# Patient Record
Sex: Male | Born: 1960
Health system: Southern US, Community
[De-identification: ages and names within clinical notes are randomized; demographics above are authoritative.]

## PROBLEM LIST (undated history)

## (undated) DIAGNOSIS — I10 Essential (primary) hypertension: Secondary | ICD-10-CM

## (undated) DIAGNOSIS — E119 Type 2 diabetes mellitus without complications: Secondary | ICD-10-CM

## (undated) DIAGNOSIS — E7800 Pure hypercholesterolemia, unspecified: Secondary | ICD-10-CM

## (undated) HISTORY — PX: HAND TENDON SURGERY: SHX663

---

## 2002-06-28 ENCOUNTER — Encounter: Payer: Self-pay | Admitting: Orthopedic Surgery

## 2002-06-28 ENCOUNTER — Ambulatory Visit (HOSPITAL_COMMUNITY): Admission: RE | Admit: 2002-06-28 | Discharge: 2002-06-28 | Payer: Self-pay | Admitting: Orthopedic Surgery

## 2003-08-30 ENCOUNTER — Emergency Department (HOSPITAL_COMMUNITY): Admission: EM | Admit: 2003-08-30 | Discharge: 2003-08-30 | Payer: Self-pay | Admitting: Emergency Medicine

## 2011-03-26 ENCOUNTER — Emergency Department (HOSPITAL_COMMUNITY): Payer: Self-pay

## 2011-03-26 ENCOUNTER — Inpatient Hospital Stay (INDEPENDENT_AMBULATORY_CARE_PROVIDER_SITE_OTHER)
Admission: RE | Admit: 2011-03-26 | Discharge: 2011-03-26 | Disposition: A | Discharge status: Home / Self Care | Payer: Self-pay | Source: Ambulatory Visit | Attending: Emergency Medicine | Admitting: Emergency Medicine

## 2011-03-26 ENCOUNTER — Emergency Department (HOSPITAL_COMMUNITY)
Admission: EM | Admit: 2011-03-26 | Discharge: 2011-03-27 | Disposition: A | Discharge status: Home / Self Care | Payer: Self-pay | Attending: Emergency Medicine | Admitting: Emergency Medicine

## 2011-03-26 DIAGNOSIS — R42 Dizziness and giddiness: Secondary | ICD-10-CM

## 2011-03-26 DIAGNOSIS — X30XXXA Exposure to excessive natural heat, initial encounter: Secondary | ICD-10-CM | POA: Insufficient documentation

## 2011-03-26 DIAGNOSIS — R0609 Other forms of dyspnea: Secondary | ICD-10-CM

## 2011-03-26 DIAGNOSIS — E119 Type 2 diabetes mellitus without complications: Secondary | ICD-10-CM | POA: Insufficient documentation

## 2011-03-26 DIAGNOSIS — R5381 Other malaise: Secondary | ICD-10-CM | POA: Insufficient documentation

## 2011-03-26 DIAGNOSIS — E7800 Pure hypercholesterolemia, unspecified: Secondary | ICD-10-CM | POA: Insufficient documentation

## 2011-03-26 DIAGNOSIS — T675XXA Heat exhaustion, unspecified, initial encounter: Secondary | ICD-10-CM | POA: Insufficient documentation

## 2011-03-26 DIAGNOSIS — R290 Tetany: Secondary | ICD-10-CM | POA: Insufficient documentation

## 2011-03-26 DIAGNOSIS — Z79899 Other long term (current) drug therapy: Secondary | ICD-10-CM | POA: Insufficient documentation

## 2011-03-26 DIAGNOSIS — R61 Generalized hyperhidrosis: Secondary | ICD-10-CM | POA: Insufficient documentation

## 2011-03-26 DIAGNOSIS — I10 Essential (primary) hypertension: Secondary | ICD-10-CM | POA: Insufficient documentation

## 2011-03-26 DIAGNOSIS — R55 Syncope and collapse: Secondary | ICD-10-CM | POA: Insufficient documentation

## 2011-03-26 LAB — DIFFERENTIAL
Eosinophils Absolute: 0.2 10*3/uL (ref 0.0–0.7)
Lymphocytes Relative: 27 % (ref 12–46)
Lymphs Abs: 3.2 10*3/uL (ref 0.7–4.0)
Monocytes Relative: 12 % (ref 3–12)
Neutro Abs: 7.3 10*3/uL (ref 1.7–7.7)
Neutrophils Relative %: 60 % (ref 43–77)

## 2011-03-26 LAB — URINALYSIS, ROUTINE W REFLEX MICROSCOPIC
Bilirubin Urine: NEGATIVE
Nitrite: NEGATIVE
Specific Gravity, Urine: 1.013 (ref 1.005–1.030)
Urobilinogen, UA: 0.2 mg/dL (ref 0.0–1.0)

## 2011-03-26 LAB — URINE MICROSCOPIC-ADD ON

## 2011-03-26 LAB — TROPONIN I: Troponin I: <0.3 ng/mL (ref <0.30)

## 2011-03-26 LAB — POCT I-STAT, CHEM 8
HCT: 47 % (ref 39.0–52.0)
Hemoglobin: 16 g/dL (ref 13.0–17.0)
Potassium: 4.1 mEq/L (ref 3.5–5.1)
Sodium: 131 mEq/L — ABNORMAL LOW (ref 135–145)

## 2011-03-26 LAB — CBC
Hemoglobin: 15.7 g/dL (ref 13.0–17.0)
MCH: 29.6 pg (ref 26.0–34.0)
MCV: 77.7 fL — ABNORMAL LOW (ref 78.0–100.0)
Platelets: 199 10*3/uL (ref 150–400)
RBC: 5.3 MIL/uL (ref 4.22–5.81)
WBC: 12.2 10*3/uL — ABNORMAL HIGH (ref 4.0–10.5)

## 2011-03-26 LAB — CK TOTAL AND CKMB (NOT AT ARMC): CK, MB: 4 ng/mL (ref 0.3–4.0)

## 2011-03-28 LAB — GLUCOSE, CAPILLARY

## 2013-08-15 ENCOUNTER — Encounter (HOSPITAL_COMMUNITY): Payer: Self-pay | Admitting: Emergency Medicine

## 2013-08-15 ENCOUNTER — Emergency Department (HOSPITAL_COMMUNITY): Payer: Worker's Compensation

## 2013-08-15 ENCOUNTER — Emergency Department (HOSPITAL_COMMUNITY)
Admission: EM | Admit: 2013-08-15 | Discharge: 2013-08-15 | Disposition: A | Discharge status: Home / Self Care | Payer: Worker's Compensation | Attending: Emergency Medicine | Admitting: Emergency Medicine

## 2013-08-15 DIAGNOSIS — W010XXA Fall on same level from slipping, tripping and stumbling without subsequent striking against object, initial encounter: Secondary | ICD-10-CM | POA: Insufficient documentation

## 2013-08-15 DIAGNOSIS — S8992XA Unspecified injury of left lower leg, initial encounter: Secondary | ICD-10-CM

## 2013-08-15 DIAGNOSIS — S8990XA Unspecified injury of unspecified lower leg, initial encounter: Secondary | ICD-10-CM | POA: Insufficient documentation

## 2013-08-15 DIAGNOSIS — M129 Arthropathy, unspecified: Secondary | ICD-10-CM | POA: Insufficient documentation

## 2013-08-15 DIAGNOSIS — E119 Type 2 diabetes mellitus without complications: Secondary | ICD-10-CM | POA: Insufficient documentation

## 2013-08-15 DIAGNOSIS — R609 Edema, unspecified: Secondary | ICD-10-CM | POA: Insufficient documentation

## 2013-08-15 DIAGNOSIS — E7800 Pure hypercholesterolemia, unspecified: Secondary | ICD-10-CM | POA: Insufficient documentation

## 2013-08-15 DIAGNOSIS — Y990 Civilian activity done for income or pay: Secondary | ICD-10-CM | POA: Insufficient documentation

## 2013-08-15 DIAGNOSIS — Y929 Unspecified place or not applicable: Secondary | ICD-10-CM | POA: Insufficient documentation

## 2013-08-15 DIAGNOSIS — I10 Essential (primary) hypertension: Secondary | ICD-10-CM | POA: Insufficient documentation

## 2013-08-15 HISTORY — DX: Type 2 diabetes mellitus without complications: E11.9

## 2013-08-15 HISTORY — DX: Pure hypercholesterolemia, unspecified: E78.00

## 2013-08-15 HISTORY — DX: Essential (primary) hypertension: I10

## 2013-08-15 MED ORDER — HYDROCODONE-ACETAMINOPHEN 5-325 MG PO TABS: 2.0000 | ORAL_TABLET | ORAL | Status: DC | PRN

## 2013-08-15 NOTE — ED Notes (Signed)
Pt states he fell last night at his job, complaining of L leg/knee pain/swelling

## 2013-08-15 NOTE — ED Provider Notes (Signed)
CSN: [id number]     Arrival date & time 08/15/13  1129 History  This chart was scribed for non-physician practitioner working with Rick Baker, MD by Rick Anderson, ED scribe. This patient was seen in room Rick Anderson/Rick Anderson and the patient's care was started at 12:58 PM.  First MD Initiated Contact with Patient 08/15/13 1222     Chief Complaint  Patient presents with  . Leg Injury    left   (Consider location/radiation/quality/duration/timing/severity/associated sxs/prior Treatment) Patient is a 52 y.o. male presenting with leg pain. The history is provided by the patient and medical records. No language interpreter was used.  Leg Pain Location:  Hip, leg, ankle and foot Time since incident:  1 day Injury: yes   Mechanism of injury: fall   Fall:    Fall occurred:  Walking   Impact surface:  Concrete   Entrapped after fall: no   Hip location:  L hip Leg location:  L leg Ankle location:  L ankle Foot location:  L foot Pain details:    Quality:  Shooting and sharp   Severity:  Moderate   Onset quality:  Sudden   Timing:  Constant Chronicity:  Recurrent Foreign body present:  No foreign bodies Tetanus status:  Up to date Prior injury to area:  Yes Relieved by:  Nothing Worsened by:  Nothing tried Ineffective treatments:  None tried Associated symptoms: swelling   Associated symptoms: no numbness    HPI Comments: Rick Anderson is a 52 y.o. male who presents to the Emergency Department complaining of constant, moderate, leg pain after falling at his job, [employer]. Pt reports his leg "buckled back" while falling on slippery surface. Pt has a previous injury to his left knee after falling and was out of work for a while. Pt was able to walk after the fall however hours after he experienced trouble walking due to pain. He states the pain goes "through internally" and is sharp. Pt also complains of left ankle pain and left hip pain that presents with swelling. He explains the left foot  had a numb sensation last night but resolved today. He did not try anything for pain. Pt has a hx of left leg arthritis. Pt has a medical hx of DM, HTN and high cholesterol.  Pt does not have any known allergies to medications.      Past Medical History  Diagnosis Date  . Diabetes mellitus without complication   . Hypertension   . High cholesterol    Past Surgical History  Procedure Laterality Date  . Hand tendon surgery     No family history on file. History  Substance Use Topics  . Smoking status: Never Smoker   . Smokeless tobacco: Never Used  . Alcohol Use: No    Review of Systems  Cardiovascular: Positive for leg swelling.  Musculoskeletal: Positive for arthralgias, gait problem, joint swelling and myalgias.  Neurological: Negative for numbness.    Allergies  Ampicillin  Home Medications  No current outpatient prescriptions on file. BP 142/81  Pulse 74  Temp(Src) 98.7 F (37.1 C) (Oral)  Resp 16  Ht 6\' 2"  (1.88 m)  Wt 295 lb (133.811 kg)  BMI 37.86 kg/m2  SpO2 100% Physical Exam  Constitutional: He appears well-developed and well-nourished. No distress.  HENT:  Head: Atraumatic.  Eyes: Conjunctivae are normal.  Neck: Normal range of motion. Neck supple.  Musculoskeletal: Normal range of motion. He exhibits edema and tenderness.       Left  hip: Normal.   Joint tenderness noted the posterior aspect of patella. No obvious deformities Neg draw test anteror and posterior No vargus and valgus maneuver  Mild crepitus noted with patella ballottement. No obvious edema   Neurological: He is alert.  Pt able to ambulate  Skin: No rash noted.  Psychiatric: He has a normal mood and affect.    ED Course  Procedures (including critical care time) DIAGNOSTIC STUDIES: Oxygen Saturation is 100% on room air, normal by my interpretation.    COORDINATION OF CARE: 1:01 PM Discussed course of care with pt which includes x-ray testing . Pt understands and agrees.   Likely meniscal tear or internal derangement.  Doubt acute fx or dislocation.  Is NVI.    Labs Review Labs Reviewed - No data to display Imaging Review Dg Knee Complete 4 Views Left  08/15/2013   CLINICAL DATA:  pain post trauma  EXAM: LEFT KNEE - COMPLETE 4+ VIEW  COMPARISON:  left knee MRI June 30, 2011  FINDINGS: Frontal, lateral, and bilateral oblique views were obtained. There is no fracture, dislocation, or effusion. There is moderate osteoarthritic change. No erosive change.  IMPRESSION: Moderate osteoarthritic change. No fracture or effusion.   Electronically Signed   By: Bretta Bang M.D.   On: 08/15/2013 14:11    EKG Interpretation   None       MDM   1. Left knee injury, initial encounter    BP 142/81  Pulse 74  Temp(Src) 98.7 F (37.1 C) (Oral)  Resp 16  Ht 6\' 2"  (1.88 m)  Wt 295 lb (133.811 kg)  BMI 37.86 kg/m2  SpO2 100%  I have reviewed nursing notes and vital signs. I personally reviewed the imaging tests through PACS system  I reviewed available ER/hospitalization records thought the EMR  I personally performed the services described in this documentation, which was scribed in my presence. The recorded information has been reviewed and is accurate.      Fayrene Helper, PA-C 08/15/13 1422

## 2013-08-16 NOTE — ED Provider Notes (Signed)
Medical screening examination/treatment/procedure(s) were performed by non-physician practitioner and as supervising physician I was immediately available for consultation/collaboration.   Vanissa Strength T Dung Prien, MD 08/16/13 1551 

## 2015-04-21 ENCOUNTER — Other Ambulatory Visit: Payer: Self-pay | Admitting: Family Medicine

## 2015-04-21 DIAGNOSIS — B1910 Unspecified viral hepatitis B without hepatic coma: Secondary | ICD-10-CM

## 2015-05-04 ENCOUNTER — Ambulatory Visit
Admission: RE | Admit: 2015-05-04 | Discharge: 2015-05-04 | Disposition: A | Discharge status: Home / Self Care | Payer: Non-veteran care | Source: Ambulatory Visit | Attending: Family Medicine | Admitting: Family Medicine

## 2015-05-04 DIAGNOSIS — B1910 Unspecified viral hepatitis B without hepatic coma: Secondary | ICD-10-CM

## 2015-05-30 ENCOUNTER — Emergency Department (HOSPITAL_COMMUNITY): Payer: Non-veteran care

## 2015-05-30 ENCOUNTER — Encounter (HOSPITAL_COMMUNITY): Payer: Self-pay | Admitting: Emergency Medicine

## 2015-05-30 ENCOUNTER — Emergency Department (HOSPITAL_COMMUNITY)
Admission: EM | Admit: 2015-05-30 | Discharge: 2015-05-30 | Disposition: A | Discharge status: Home / Self Care | Payer: Non-veteran care | Attending: Emergency Medicine | Admitting: Emergency Medicine

## 2015-05-30 DIAGNOSIS — E119 Type 2 diabetes mellitus without complications: Secondary | ICD-10-CM | POA: Insufficient documentation

## 2015-05-30 DIAGNOSIS — R55 Syncope and collapse: Secondary | ICD-10-CM | POA: Insufficient documentation

## 2015-05-30 DIAGNOSIS — N289 Disorder of kidney and ureter, unspecified: Secondary | ICD-10-CM | POA: Insufficient documentation

## 2015-05-30 DIAGNOSIS — I10 Essential (primary) hypertension: Secondary | ICD-10-CM | POA: Insufficient documentation

## 2015-05-30 LAB — BASIC METABOLIC PANEL
ANION GAP: 13 (ref 5–15)
BUN: 21 mg/dL — ABNORMAL HIGH (ref 6–20)
CO2: 23 mmol/L (ref 22–32)
CREATININE: 2.16 mg/dL — AB (ref 0.61–1.24)
Calcium: 9 mg/dL (ref 8.9–10.3)
Chloride: 97 mmol/L — ABNORMAL LOW (ref 101–111)
GFR calc Af Amer: 38 mL/min — ABNORMAL LOW (ref >60)
GFR, EST NON AFRICAN AMERICAN: 32 mL/min — AB (ref >60)
Glucose, Bld: 265 mg/dL — ABNORMAL HIGH (ref 65–99)
POTASSIUM: 4.5 mmol/L (ref 3.5–5.1)
SODIUM: 133 mmol/L — AB (ref 135–145)

## 2015-05-30 LAB — CBC WITH DIFFERENTIAL/PLATELET
BASOS ABS: 0 10*3/uL (ref 0.0–0.1)
Basophils Relative: 0 % (ref 0–1)
EOS ABS: 0 10*3/uL (ref 0.0–0.7)
Eosinophils Relative: 0 % (ref 0–5)
HCT: 38.1 % — ABNORMAL LOW (ref 39.0–52.0)
HEMOGLOBIN: 13.6 g/dL (ref 13.0–17.0)
LYMPHS ABS: 2 10*3/uL (ref 0.7–4.0)
Lymphocytes Relative: 13 % (ref 12–46)
MCH: 28.9 pg (ref 26.0–34.0)
MCHC: 35.7 g/dL (ref 30.0–36.0)
MCV: 80.9 fL (ref 78.0–100.0)
Monocytes Absolute: 1.3 10*3/uL — ABNORMAL HIGH (ref 0.1–1.0)
Monocytes Relative: 8 % (ref 3–12)
NEUTROS ABS: 11.5 10*3/uL — AB (ref 1.7–7.7)
Neutrophils Relative %: 79 % — ABNORMAL HIGH (ref 43–77)
PLATELETS: 256 10*3/uL (ref 150–400)
RBC: 4.71 MIL/uL (ref 4.22–5.81)
RDW: 14.5 % (ref 11.5–15.5)
WBC: 14.8 10*3/uL — AB (ref 4.0–10.5)

## 2015-05-30 LAB — CBG MONITORING, ED: GLUCOSE-CAPILLARY: 261 mg/dL — AB (ref 65–99)

## 2015-05-30 LAB — I-STAT TROPONIN, ED
TROPONIN I, POC: 0.01 ng/mL (ref 0.00–0.08)
TROPONIN I, POC: 0 ng/mL (ref 0.00–0.08)

## 2015-05-30 NOTE — ED Provider Notes (Signed)
CSN: [id number]     Arrival date & time 05/30/15  1653 History   First MD Initiated Contact with Patient 05/30/15 1655     Chief Complaint  Patient presents with  . Loss of Consciousness  . overheated      (Consider location/radiation/quality/duration/timing/severity/associated sxs/prior Treatment) HPI   PCP: No primary care provider on file. Blood pressure 114/58, pulse 93, temperature 99 F (37.2 C), temperature source Oral, resp. rate 16, height  (1.88 m), weight 290 lb (131.543 kg), SpO2 98 %.  Rick Anderson is a 54 y.o.male with a significant PMH of diabetes, hypertension, and high cholesterol presents to the ER with complaints of syncope. The patient reports eating and drinking much all day and a lot of time outside, the weather is in the 90's today. He reports feeling fluttering in his chest with pressure for about 30 minutes before his syncopal episode. He was doing an interview  With [employer] and had to ask to sit down. The pain went away for approx 15 minutes but then it came back and he told his wife that he needed to go to the hospital. THe patient is requesting food and drink. He says that his CP will come back without eating or drinking anything because he is so hungry.  The patient denies diaphoresis, fever, headache, weakness (general or focal), confusion, change of vision,  neck pain, dysphagia, aphagia, chest pain, shortness of breath,  back pain, abdominal pains, nausea, vomiting, diarrhea, lower extremity swelling, rash.   Past Medical History  Diagnosis Date  . Diabetes mellitus without complication   . Hypertension   . High cholesterol    Past Surgical History  Procedure Laterality Date  . Hand tendon surgery     No family history on file. Social History  Substance Use Topics  . Smoking status: Never Smoker   . Smokeless tobacco: Never Used  . Alcohol Use: No    Review of Systems  10 Systems reviewed and are negative for acute change except  as noted in the HPI.   Allergies  Ampicillin  Home Medications   Prior to Admission medications   Medication Sig Start Date End Date Taking? Authorizing Provider  HYDROcodone-acetaminophen (NORCO/VICODIN) 5-325 MG per tablet Take 2 tablets by mouth every 4 (four) hours as needed for pain. 08/15/13   Fayrene Helper, PA-C   BP 115/63 mmHg  Pulse 94  Temp(Src) 99 F (37.2 C) (Oral)  Resp 17  Ht  (1.88 m)  Wt 290 lb (131.543 kg)  BMI 37.22 kg/m2  SpO2 98% Physical Exam  Constitutional: He is oriented to person, place, and time. He appears well-developed and well-nourished. No distress.  Morbidly obese  HENT:  Head: Normocephalic and atraumatic.  Eyes: Pupils are equal, round, and reactive to light.  Neck: Normal range of motion. Neck supple.  Cardiovascular: Normal rate and regular rhythm.   Pulmonary/Chest: Effort normal. No accessory muscle usage. No respiratory distress. He exhibits no tenderness, no bony tenderness and no retraction.  Abdominal: Soft.  Musculoskeletal:  No lower extremity swelling.  Neurological: He is alert and oriented to person, place, and time.  Skin: Skin is warm and dry.  Nursing note and vitals reviewed.   ED Course  Procedures (including critical care time) Labs Review Labs Reviewed  BASIC METABOLIC PANEL - Abnormal; Notable for the following:    Sodium 133 (*)    Chloride 97 (*)    Glucose, Bld 265 (*)    BUN  21 (*)    Creatinine, Ser 2.16 (*)    GFR calc non Af Amer 32 (*)    GFR calc Af Amer 38 (*)    All other components within normal limits  CBC WITH DIFFERENTIAL/PLATELET - Abnormal; Notable for the following:    WBC 14.8 (*)    HCT 38.1 (*)    Neutrophils Relative % 79 (*)    Neutro Abs 11.5 (*)    Monocytes Absolute 1.3 (*)    All other components within normal limits  CBG MONITORING, ED  I-STAT TROPOININ, ED  Rosezena Sensor, ED    Imaging Review Dg Chest 2 View  05/30/2015   CLINICAL DATA:  Dizzy.  Shortness breath.   Midsternal chest pain.  EXAM: CHEST - 2 VIEW  COMPARISON:  Two-view chest x-ray 03/26/2011  FINDINGS: The heart size is normal. Lung volumes are low. Interstitial coarsening is similar to the prior study. No significant airspace consolidation is evident. The visualized soft tissues and bony thorax are unremarkable.  IMPRESSION: 1. Similar appearance of lower lobe interstitial prominence suggesting fibrosis or scarring. This may represent atelectasis. 2. Low lung volumes.   Electronically Signed   By: Marin Roberts M.D.   On: 05/30/2015 18:52   I, Dorthula Matas, personally reviewed and evaluated these images and lab results as part of my medical decision-making.   EKG Interpretation   Date/Time:  Saturday May 30 2015 16:59:23 EDT Ventricular Rate:  91 PR Interval:  171 QRS Duration: 90 QT Interval:  336 QTC Calculation: 413 R Axis:   59 Text Interpretation:  Sinus rhythm Probable anteroseptal infarct, old  Nonspecific T abnormalities, inferior leads No significant change since  last tracing Confirmed by BEATON  MD, Sanjit ([id number]) on 05/30/2015 5:34:03  PM      MDM   Final diagnoses:  Syncope, unspecified syncope type  Renal insufficiency   Discussed patient with Dr. Radford Pax, his symptoms are most likely due to dehydration as he has some renal insufficiency. Patient admits not eating or drink anything today, doesn't know why. Otherwise his labs are unremarkable, negative troponin and nonacute chest x-ray. Will give referral to Cardiology and back to PCP.   Pt rehydrated in the ED.  Medications - No data to display  54 y.o.Rick Anderson's evaluation in the Emergency Department is complete. It has been determined that no acute conditions requiring further emergency intervention are present at this time. The patient/guardian have been advised of the diagnosis and plan. We have discussed signs and symptoms that warrant return to the ED, such as changes or worsening in  symptoms.  Vital signs are stable at discharge. Filed Vitals:   05/30/15 1930  BP: 115/63  Pulse: 94  Temp:   Resp: 17    Patient/guardian has voiced understanding and agreed to follow-up with the PCP or specialist.     Marlon Pel, PA-C 05/30/15 2109  Nelva Nay, MD 05/31/15 2318

## 2015-05-30 NOTE — ED Notes (Addendum)
To ED via [facility] with syncopal episode after being outside all day-- received 500cc/NS per EMS . Pt alert on arrival -- states feels better. Asking for food-- has not eaten or had anything to drink all day

## 2015-05-30 NOTE — ED Notes (Signed)
Pt eating salad, alert-- states he feels better--

## 2015-05-30 NOTE — Discharge Instructions (Signed)
Dehydration, Adult Dehydration is when you lose more fluids from the body than you take in. Vital organs like the kidneys, brain, and heart cannot function without a proper amount of fluids and salt. Any loss of fluids from the body can cause dehydration.  CAUSES   Vomiting.  Diarrhea.  Excessive sweating.  Excessive urine output.  Fever. SYMPTOMS  Mild dehydration  Thirst.  Dry lips.  Slightly dry mouth. Moderate dehydration  Very dry mouth.  Sunken eyes.  Skin does not bounce back quickly when lightly pinched and released.  Dark urine and decreased urine production.  Decreased tear production.  Headache. Severe dehydration  Very dry mouth.  Extreme thirst.  Rapid, weak pulse (more than 100 beats per minute at rest).  Cold hands and feet.  Not able to sweat in spite of heat and temperature.  Rapid breathing.  Blue lips.  Confusion and lethargy.  Difficulty being awakened.  Minimal urine production.  No tears. DIAGNOSIS  Your caregiver will diagnose dehydration based on your symptoms and your exam. Blood and urine tests will help confirm the diagnosis. The diagnostic evaluation should also identify the cause of dehydration. TREATMENT  Treatment of mild or moderate dehydration can often be done at home by increasing the amount of fluids that you drink. It is best to drink small amounts of fluid more often. Drinking too much at one time can make vomiting worse. Refer to the home care instructions below. Severe dehydration needs to be treated at the hospital where you will probably be given intravenous (IV) fluids that contain water and electrolytes. HOME CARE INSTRUCTIONS   Ask your caregiver about specific rehydration instructions.  Drink enough fluids to keep your urine clear or pale yellow.  Drink small amounts frequently if you have nausea and vomiting.  Eat as you normally do.  Avoid:  Foods or drinks high in sugar.  Carbonated  drinks.  Juice.  Extremely hot or cold fluids.  Drinks with caffeine.  Fatty, greasy foods.  Alcohol.  Tobacco.  Overeating.  Gelatin desserts.  Wash your hands well to avoid spreading bacteria and viruses.  Only take over-the-counter or prescription medicines for pain, discomfort, or fever as directed by your caregiver.  Ask your caregiver if you should continue all prescribed and over-the-counter medicines.  Keep all follow-up appointments with your caregiver. SEEK MEDICAL CARE IF:  You have abdominal pain and it increases or stays in one area (localizes).  You have a rash, stiff neck, or severe headache.  You are irritable, sleepy, or difficult to awaken.  You are weak, dizzy, or extremely thirsty. SEEK IMMEDIATE MEDICAL CARE IF:   You are unable to keep fluids down or you get worse despite treatment.  You have frequent episodes of vomiting or diarrhea.  You have blood or green matter (bile) in your vomit.  You have blood in your stool or your stool looks black and tarry.  You have not urinated in 6 to 8 hours, or you have only urinated a small amount of very dark urine.  You have a fever.  You faint. MAKE SURE YOU:   Understand these instructions.  Will watch your condition.  Will get help right away if you are not doing well or get worse. Document Released: 10/03/2005 Document Revised: 12/26/2011 Document Reviewed: 05/23/2011 Exit[employer] Patient Information 2015 [employer]. This information is not intended to replace advice given to you by your health care provider. Make sure you discuss any questions you have with your health care   provider.  

## 2015-11-07 ENCOUNTER — Ambulatory Visit (INDEPENDENT_AMBULATORY_CARE_PROVIDER_SITE_OTHER): Payer: Non-veteran care | Admitting: Physician Assistant

## 2015-11-07 DIAGNOSIS — E119 Type 2 diabetes mellitus without complications: Secondary | ICD-10-CM | POA: Insufficient documentation

## 2015-11-07 DIAGNOSIS — K047 Periapical abscess without sinus: Secondary | ICD-10-CM

## 2015-11-07 MED ORDER — CLINDAMYCIN HCL 300 MG PO CAPS: 300.0000 mg | ORAL_CAPSULE | Freq: Three times a day (TID) | ORAL | Status: AC

## 2015-11-07 NOTE — Progress Notes (Signed)
   Rick Anderson  MRN: [id number] DOB: February 04, 1961  Subjective:  Pt presents to clinic with tooth pain that started last night.  He has some left over motrin and pain medication that helped him sleep.  He knows he needs the dentist but he has had dental problems before and he knows that he needs the infection treated before the dentist can work on his teeth.  He already wears upper and lower partials.  [facility] -   Patient Active Problem List   Diagnosis Date Noted  . Diabetes type 2, controlled (HCC) 11/07/2015    No current outpatient prescriptions on file prior to visit.   No current facility-administered medications on file prior to visit.    Allergies  Allergen Reactions  . Ampicillin Rash    Review of Systems  Constitutional: Negative for fever and chills.  HENT: Positive for dental problem.    Objective:  BP 124/84 mmHg  Pulse 74  Temp(Src) 98 F (36.7 C) (Oral)  Resp 18  Ht  (1.854 m)  Wt 311 lb 3.2 oz (141.159 kg)  BMI 41.07 kg/m2  SpO2 98%  Physical Exam  Constitutional: He is oriented to person, place, and time and well-developed, well-nourished, and in no distress.  HENT:  Head: Normocephalic and atraumatic.  Right Ear: External ear normal.  Left Ear: External ear normal.  Mouth/Throat: Abnormal dentition. Dental caries present.    Eyes: Conjunctivae are normal.  Neck: Normal range of motion.  Pulmonary/Chest: Effort normal.  Neurological: He is alert and oriented to person, place, and time. Gait normal.  Skin: Skin is warm and dry.  Psychiatric: Mood, memory, affect and judgment normal.    Assessment and Plan :  Dental abscess - Plan: clindamycin (CLEOCIN) 300 MG capsule, Care order/instruction   Will start abx [facility] an [facility] - salt water gargles  Benny Lennert PA-C  [facility] 11/07/2015 12:03 PM

## 2016-02-29 ENCOUNTER — Encounter (HOSPITAL_COMMUNITY): Payer: Self-pay | Admitting: Emergency Medicine

## 2016-02-29 ENCOUNTER — Emergency Department (HOSPITAL_COMMUNITY): Payer: Non-veteran care

## 2016-02-29 ENCOUNTER — Emergency Department (HOSPITAL_COMMUNITY)
Admission: EM | Admit: 2016-02-29 | Discharge: 2016-02-29 | Disposition: A | Discharge status: Home / Self Care | Payer: Non-veteran care | Attending: Emergency Medicine | Admitting: Emergency Medicine

## 2016-02-29 DIAGNOSIS — N281 Cyst of kidney, acquired: Secondary | ICD-10-CM | POA: Diagnosis not present

## 2016-02-29 DIAGNOSIS — Z7984 Long term (current) use of oral hypoglycemic drugs: Secondary | ICD-10-CM | POA: Diagnosis not present

## 2016-02-29 DIAGNOSIS — Z79899 Other long term (current) drug therapy: Secondary | ICD-10-CM | POA: Diagnosis not present

## 2016-02-29 DIAGNOSIS — E7800 Pure hypercholesterolemia, unspecified: Secondary | ICD-10-CM | POA: Insufficient documentation

## 2016-02-29 DIAGNOSIS — R319 Hematuria, unspecified: Secondary | ICD-10-CM | POA: Diagnosis present

## 2016-02-29 DIAGNOSIS — Z79891 Long term (current) use of opiate analgesic: Secondary | ICD-10-CM | POA: Diagnosis not present

## 2016-02-29 DIAGNOSIS — E119 Type 2 diabetes mellitus without complications: Secondary | ICD-10-CM | POA: Diagnosis not present

## 2016-02-29 DIAGNOSIS — I10 Essential (primary) hypertension: Secondary | ICD-10-CM | POA: Diagnosis not present

## 2016-02-29 LAB — CBC WITH DIFFERENTIAL/PLATELET
BASOS ABS: 0 10*3/uL (ref 0.0–0.1)
BASOS PCT: 0 %
Eosinophils Absolute: 0.2 10*3/uL (ref 0.0–0.7)
Eosinophils Relative: 3 %
HEMATOCRIT: 37.5 % — AB (ref 39.0–52.0)
Hemoglobin: 13.7 g/dL (ref 13.0–17.0)
LYMPHS ABS: 2.9 10*3/uL (ref 0.7–4.0)
LYMPHS PCT: 43 %
MCH: 29 pg (ref 26.0–34.0)
MCHC: 36.5 g/dL — ABNORMAL HIGH (ref 30.0–36.0)
MCV: 79.4 fL (ref 78.0–100.0)
MONO ABS: 0.5 10*3/uL (ref 0.1–1.0)
Monocytes Relative: 8 %
Neutro Abs: 3.2 10*3/uL (ref 1.7–7.7)
Neutrophils Relative %: 46 %
Platelets: 308 10*3/uL (ref 150–400)
RBC: 4.72 MIL/uL (ref 4.22–5.81)
RDW: 14 % (ref 11.5–15.5)
WBC: 6.9 10*3/uL (ref 4.0–10.5)

## 2016-02-29 LAB — COMPREHENSIVE METABOLIC PANEL
ALBUMIN: 4.2 g/dL (ref 3.5–5.0)
ALT: 32 U/L (ref 17–63)
AST: 35 U/L (ref 15–41)
Alkaline Phosphatase: 45 U/L (ref 38–126)
Anion gap: 6 (ref 5–15)
BILIRUBIN TOTAL: 1 mg/dL (ref 0.3–1.2)
BUN: 12 mg/dL (ref 6–20)
CHLORIDE: 105 mmol/L (ref 101–111)
CO2: 25 mmol/L (ref 22–32)
CREATININE: 0.63 mg/dL (ref 0.61–1.24)
Calcium: 8.9 mg/dL (ref 8.9–10.3)
GFR calc Af Amer: >60 mL/min (ref >60)
GFR calc non Af Amer: >60 mL/min (ref >60)
GLUCOSE: 206 mg/dL — AB (ref 65–99)
POTASSIUM: 4.1 mmol/L (ref 3.5–5.1)
Sodium: 136 mmol/L (ref 135–145)
TOTAL PROTEIN: 8 g/dL (ref 6.5–8.1)

## 2016-02-29 LAB — URINALYSIS, ROUTINE W REFLEX MICROSCOPIC
Bilirubin Urine: NEGATIVE
GLUCOSE, UA: 100 mg/dL — AB
HGB URINE DIPSTICK: NEGATIVE
Ketones, ur: NEGATIVE mg/dL
Leukocytes, UA: NEGATIVE
Nitrite: NEGATIVE
PH: 5.5 (ref 5.0–8.0)
Protein, ur: NEGATIVE mg/dL
SPECIFIC GRAVITY, URINE: 1.024 (ref 1.005–1.030)

## 2016-02-29 MED ORDER — OXYCODONE-ACETAMINOPHEN 5-325 MG PO TABS: 1.0000 | ORAL_TABLET | Freq: Four times a day (QID) | ORAL | Status: DC | PRN

## 2016-02-29 NOTE — Discharge Instructions (Signed)
Renal Mass A renal mass is a growth in the kidney. A renal mass may be solid or filled with fluid. Those that are filled with fluid are called cysts.  Ureters appears to be most likely a cyst that is now bleeding. This needs further imaging. It was recommended by radiology that you get an outpatient renal MRI with contrast to further evaluate this possible cyst. CAUSES Usually, the cause of a renal mass is unknown. SIGNS AND SYMPTOMS Symptoms may include:  Blood in the urine.  Pain in the side or back (flank pain).  Feeling full soon after eating.  Weight loss.  Swelling in the abdomen. Some renal masses do not cause symptoms. DIAGNOSIS A renal mass may be found with a CT scan, ultrasound, or MRI of your abdomen.  TREATMENT Treatment will depend on the type of renal mass.  If the renal mass is a cyst that is not causing problems, you will not need treatment.  If the renal mass is a cyst that is causing problems, it may need to be drained during a type of surgery called laparoscopic surgery.  If the renal mass is solid, it may need to be removed with a surgery to your abdomen.  If the renal mass is caused by kidney cancer, you may need surgery to remove all or part of your kidney. You may need to see your health care provider once or twice a year to have CT scans and ultrasounds done. Having these tests will allow your health care provider to see if your renal mass has changed or gotten bigger. HOME CARE INSTRUCTIONS What you need to do at home will depend on the type of renal mass that you have. The treatment you had also will make a difference. Follow the instructions your health care provider gives you. In general:  Keep all follow-up visits as directed by your health care provider.  Take medicines only as directed by your health care provider.   SEEK IMMEDIATE MEDICAL CARE IF:   Your pain gets worse.  There is blood in your urine.  You cannot urinate.  You have chest  pain.  You have trouble breathing. MAKE SURE YOU:  Understand these instructions.  Will watch your condition.  Will get help right away if you are not doing well or get worse.   This information is not intended to replace advice given to you by your health care provider. Make sure you discuss any questions you have with your health care provider.   Document Released: 04/30/2014 Document Reviewed: 04/30/2014 Elsevier Interactive Patient Education [employer]2016 [employer]

## 2016-02-29 NOTE — ED Notes (Signed)
Bed: WA22 Expected date:  Expected time:  Means of arrival:  Comments: 

## 2016-02-29 NOTE — ED Provider Notes (Signed)
CSN: [id number]     Arrival date & time 02/29/16  5784 History   First MD Initiated Contact with Patient 02/29/16 1117     Chief Complaint  Patient presents with  . Hematuria     (Consider location/radiation/quality/duration/timing/severity/associated sxs/prior Treatment) HPI Comments: 55 year old male with history of diabetes presents for right-sided back pain and blood in his urine. The patient reports that over the weekend he was doing a lot of heavy lifting because they had just moved. He said that he started to notice pain in his right back and it seemed to wrap around and go into his right groin. He said today he noted blood after he urinated. He reports burning after he urinates. No frequency. No fevers or chills. No nausea or vomiting. He is able to walk without issue. Reports normal strength and sensation.   Past Medical History  Diagnosis Date  . Diabetes mellitus without complication (HCC)   . Hypertension   . High cholesterol    Past Surgical History  Procedure Laterality Date  . Hand tendon surgery     History reviewed. No pertinent family history. Social History  Substance Use Topics  . Smoking status: Never Smoker   . Smokeless tobacco: Never Used  . Alcohol Use: No    Review of Systems  Constitutional: Negative for fever, chills and fatigue.  HENT: Negative for congestion, postnasal drip, rhinorrhea and sinus pressure.   Eyes: Negative for visual disturbance.  Respiratory: Negative for cough, chest tightness and shortness of breath.   Cardiovascular: Negative for chest pain and palpitations.  Gastrointestinal: Positive for abdominal pain. Negative for nausea, vomiting and diarrhea.  Genitourinary: Positive for dysuria and hematuria. Negative for urgency, frequency, decreased urine volume, penile swelling, scrotal swelling, penile pain and testicular pain.  Musculoskeletal: Positive for back pain. Negative for myalgias.  Skin: Negative for rash.  Neurological:  Negative for dizziness, weakness and headaches.  Hematological: Does not bruise/bleed easily.      Allergies  Ampicillin  Home Medications   Prior to Admission medications   Medication Sig Start Date End Date Taking? Authorizing Provider  glimepiride (AMARYL) 1 MG tablet Take 1 mg by mouth daily.   Yes Historical Provider, MD  metFORMIN (GLUCOPHAGE) 500 MG tablet Take 1,000 mg by mouth 2 (two) times daily.   Yes Historical Provider, MD  pioglitazone (ACTOS) 30 MG tablet Take 30 mg by mouth daily.   Yes Historical Provider, MD  oxyCODONE-acetaminophen (PERCOCET/ROXICET) 5-325 MG tablet Take 1-2 tablets by mouth every 6 (six) hours as needed for moderate pain or severe pain. 02/29/16   Leta Baptist, MD   BP 127/77 mmHg  Pulse 70  Temp(Src) 98.6 F (37 C) (Oral)  Resp 16  SpO2 97% Physical Exam  Constitutional: He is oriented to person, place, and time. He appears well-developed and well-nourished. No distress.  HENT:  Head: Normocephalic and atraumatic.  Right Ear: External ear normal.  Left Ear: External ear normal.  Mouth/Throat: Oropharynx is clear and moist. No oropharyngeal exudate.  Eyes: EOM are normal. Pupils are equal, round, and reactive to light.  Neck: Normal range of motion. Neck supple.  Cardiovascular: Normal rate, regular rhythm, normal heart sounds and intact distal pulses.   No murmur heard. Pulmonary/Chest: Effort normal. No respiratory distress. He has no wheezes. He has no rales.  Abdominal: Soft. He exhibits no distension. There is no tenderness. Hernia confirmed negative in the right inguinal area and confirmed negative in the left inguinal area.  Genitourinary: Testes  normal and penis normal. Right testis shows no mass, no swelling and no tenderness. Left testis shows no mass, no swelling and no tenderness. Circumcised. No penile erythema. No discharge found.  Musculoskeletal: He exhibits no edema.       Right hip: Normal.       Left hip: Normal.        Lumbar back: He exhibits pain. He exhibits normal range of motion, no bony tenderness, no edema, no deformity and normal pulse.       Back:  Neurological: He is alert and oriented to person, place, and time. He has normal strength. No cranial nerve deficit or sensory deficit. Gait normal.  Skin: Skin is warm and dry. No rash noted. He is not diaphoretic.  Vitals reviewed.   ED Course  Procedures (including critical care time) Labs Review Labs Reviewed  URINALYSIS, ROUTINE W REFLEX MICROSCOPIC (NOT AT [facility]) - Abnormal; Notable for the following:    Glucose, UA 100 (*)    All other components within normal limits  CBC WITH DIFFERENTIAL/PLATELET - Abnormal; Notable for the following:    HCT 37.5 (*)    MCHC 36.5 (*)    All other components within normal limits  COMPREHENSIVE METABOLIC PANEL - Abnormal; Notable for the following:    Glucose, Bld 206 (*)    All other components within normal limits    Imaging Review Ct Renal Stone Study  02/29/2016  CLINICAL DATA:  Low back and right flank pain.  02/27/2016 EXAM: CT ABDOMEN AND PELVIS WITHOUT CONTRAST TECHNIQUE: Multidetector CT imaging of the abdomen and pelvis was performed following the standard protocol without IV contrast. COMPARISON:  Abdominal ultrasound dated 7 18 2016 FINDINGS: Lower chest: No acute findings. Heavy calcified atherosclerotic disease of the coronary arteries. Hepatobiliary: No mass visualized on this un-enhanced exam. Pancreas: No mass or inflammatory process identified on this un-enhanced exam. Spleen: Within normal limits in size. Adrenals/Urinary Tract: No evidence of urolithiasis or hydronephrosis. There is a partially exophytic 2.3 cm hyperdense to renal parenchyma mass off of the inferior pole of the right kidney. Stomach/Bowel: No evidence of obstruction, inflammatory process, or abnormal fluid collections. Vascular/Lymphatic: No pathologically enlarged lymph nodes. No evidence of abdominal aortic aneurysm.  Atherosclerotic disease of the aorta is noted. Reproductive: No mass or other significant abnormality. Other: None. Musculoskeletal: No suspicious bone lesions identified. Well corticated fragmentation of the pubic symphysis may represent prior trauma or chronic injury. L5-S1 degenerative disc disease is seen. IMPRESSION: 2.3 cm hyperdense partially exophytic right renal lesion. This may represent a benign hemorrhagic or proteinaceous cyst, however solid renal lesion cannot be excluded. MRI of the abdomen, renal protocol, is recommended. Atherosclerotic disease of the aorta. Electronically Signed   By: Ted Mcalpine M.D.   On: 02/29/2016 13:22   I have personally reviewed and evaluated these images and lab results as part of my medical decision-making.   EKG Interpretation None      MDM  Patient was seen and evaluated in stable condition. Benign examination. CT of the abdomen showed possible cystic structure on the right kidney that appeared to likely be hemorrhagic. No other acute finding. Patient refused pain medication here and felt comfortable with plan for discharge and outpatient follow-up. This finding was discussed with him and his wife. Patient is to follow-up outpatient. He was told that it was recommended that he have an MRI completed outpatient. He was provided with a prescription for pain medicine. He said that he would follow-up as directed. Strict  return precautions were given. Final diagnoses:  Renal cyst    1. Right renal lesion, probable hemorrhagic cyst    Leta BaptistEmily Roe Nguyen, MD 02/29/16 1419

## 2016-02-29 NOTE — ED Notes (Signed)
Pt c/o blood in his urine since Sunday. Pt reports burning with urination and lower back pain radiating to the groin.

## 2016-10-13 ENCOUNTER — Emergency Department (HOSPITAL_COMMUNITY)
Admission: EM | Admit: 2016-10-13 | Discharge: 2016-10-13 | Disposition: A | Discharge status: Home / Self Care | Payer: Worker's Compensation | Attending: Emergency Medicine | Admitting: Emergency Medicine

## 2016-10-13 ENCOUNTER — Emergency Department (HOSPITAL_COMMUNITY): Payer: Worker's Compensation

## 2016-10-13 ENCOUNTER — Encounter (HOSPITAL_COMMUNITY): Payer: Self-pay | Admitting: Emergency Medicine

## 2016-10-13 DIAGNOSIS — Y929 Unspecified place or not applicable: Secondary | ICD-10-CM | POA: Diagnosis not present

## 2016-10-13 DIAGNOSIS — Y939 Activity, unspecified: Secondary | ICD-10-CM | POA: Insufficient documentation

## 2016-10-13 DIAGNOSIS — M25562 Pain in left knee: Secondary | ICD-10-CM

## 2016-10-13 DIAGNOSIS — Y990 Civilian activity done for income or pay: Secondary | ICD-10-CM | POA: Diagnosis not present

## 2016-10-13 DIAGNOSIS — I10 Essential (primary) hypertension: Secondary | ICD-10-CM | POA: Insufficient documentation

## 2016-10-13 DIAGNOSIS — Z7984 Long term (current) use of oral hypoglycemic drugs: Secondary | ICD-10-CM | POA: Diagnosis not present

## 2016-10-13 DIAGNOSIS — X501XXA Overexertion from prolonged static or awkward postures, initial encounter: Secondary | ICD-10-CM | POA: Insufficient documentation

## 2016-10-13 DIAGNOSIS — S8992XA Unspecified injury of left lower leg, initial encounter: Secondary | ICD-10-CM | POA: Diagnosis present

## 2016-10-13 DIAGNOSIS — E119 Type 2 diabetes mellitus without complications: Secondary | ICD-10-CM | POA: Diagnosis not present

## 2016-10-13 DIAGNOSIS — S93402A Sprain of unspecified ligament of left ankle, initial encounter: Secondary | ICD-10-CM | POA: Insufficient documentation

## 2016-10-13 DIAGNOSIS — M25561 Pain in right knee: Secondary | ICD-10-CM

## 2016-10-13 NOTE — ED Notes (Signed)
Bed: WTR9 Expected date:  Expected time:  Means of arrival:  Comments: 

## 2016-10-13 NOTE — ED Triage Notes (Signed)
Patient reports bilateral knee pain after tripping over a rock this morning. Patient is ambulatory, however, increased pain with ambulation.

## 2016-10-13 NOTE — ED Provider Notes (Signed)
[facility]-EMERGENCY DEPT Provider Note   CSN: [id number] Arrival date & time: 10/13/16  1217  By signing my name below, I, Rick Anderson, attest that this documentation has been prepared under the direction and in the presence of [name] PA-C.  Electronically Signed: Vista Minkobert Anderson, ED Scribe. 10/13/16. 4:46 PM.  History   Chief Complaint Chief Complaint  Patient presents with  . Knee Pain    Bilateral    HPI HPI Comments: Rick Anderson is a 55 y.o. male, with Hx of fractured left tibia fracture, DM, arthritis, who presents to the Emergency Department complaining of throbbing bilateral knee pain s/p an incident that occurred this morning. 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 Pt is unsure which way his ankle twisted. He reports worst pain to left knee, worse than his right knee. He states that he is able to ambulate but with increased difficulty due to pain to his left knee. He states that, "every time I walk it feels like a rod sticking up through my left leg". He also reports swelling to his left ankle that he noticed since arriving to the ED. Pt reports exacerbating pain. He does not receive injections for his chronic knee pain although has been advised to. No numbness. He is able to ambulate with some difficulty.   The history is provided by the patient. No language interpreter was used.    Past Medical History:  Diagnosis Date  . Diabetes mellitus without complication (HCC)   . High cholesterol   . Hypertension     Patient Active Problem List   Diagnosis Date Noted  . Diabetes type 2, controlled (HCC) 11/07/2015    Past Surgical History:  Procedure Laterality Date  . HAND TENDON SURGERY       Home Medications    Prior to Admission medications   Medication Sig Start Date End Date Taking? Authorizing Provider  glimepiride (AMARYL)  1 MG tablet Take 1 mg by mouth daily.    Historical Provider, MD  metFORMIN (GLUCOPHAGE) 500 MG tablet Take 1,000 mg by mouth 2 (two) times daily.    Historical Provider, MD  oxyCODONE-acetaminophen (PERCOCET/ROXICET) 5-325 MG tablet Take 1-2 tablets by mouth every 6 (six) hours as needed for moderate pain or severe pain. 02/29/16   Leta BaptistEmily Roe Nguyen, MD  pioglitazone (ACTOS) 30 MG tablet Take 30 mg by mouth daily.    Historical Provider, MD    Family History No family history on file.  Social History Social History  Substance Use Topics  . Smoking status: Never Smoker  . Smokeless tobacco: Never Used  . Alcohol use No    Allergies   Ampicillin   Review of Systems Review of Systems  Cardiovascular: Negative for leg swelling.  Musculoskeletal: Positive for arthralgias (bilateral knees, left worse than right) and joint swelling (left ankle). Negative for back pain and myalgias.  Neurological: Negative for numbness.     Physical Exam Updated Vital Signs BP 152/88   Pulse 101   Temp 97.7 F (36.5 C)   Resp 16   Ht 6\' 2"  (1.88 m)   Wt (!) 305 lb (138.3 kg)   SpO2 97%   BMI 39.16 kg/m   Physical Exam  Constitutional: He is oriented to person, place, and time. He appears well-developed and well-nourished. No distress.  Pleasant, NAD, obese  HENT:  Head: Normocephalic and atraumatic.  Eyes: Conjunctivae are normal. Pupils are equal, round, and reactive to light. Right eye exhibits no discharge. Left eye exhibits no discharge. No scleral icterus.  Neck: Normal range of motion.  Cardiovascular: Normal rate.   Pulmonary/Chest: Effort normal. No respiratory distress.  Abdominal: He exhibits no distension.  Musculoskeletal:  Right lower extremity: No obvious swelling or deformity. Diffuse tenderness to palpation of knee and ankle. Decreased ROM of knee and ankle due to pain. 5/5 strength in knee and ankle. N/V intact.  Left knee: No obvious swelling or deformity. Diffuse  tenderness to palpation. FROM. N/V intact.    Neurological: He is alert and oriented to person, place, and time.  Skin: Skin is warm and dry.  Psychiatric: He has a normal mood and affect. His behavior is normal.  Nursing note and vitals reviewed.    ED Treatments / Results  DIAGNOSTIC STUDIES: Oxygen Saturation is 97% on RA, normal by my interpretation.  COORDINATION OF CARE: 4:48 PM-Discussed treatment plan with pt at bedside and pt agreed to plan.   Labs (all labs ordered are listed, but only abnormal results are displayed) Labs Reviewed - No data to display  EKG  EKG Interpretation None       Radiology Dg Knee Complete 4 Views Left  Result Date: 10/13/2016 CLINICAL DATA:  Bilateral knee pain, post fall this morning EXAM: LEFT KNEE - COMPLETE 4+ VIEW COMPARISON:  08/15/2013 FINDINGS: Degenerative changes in the left knee with joint space narrowing and spurring, most pronounced in the patellofemoral compartment and medial compartment. No acute bony abnormality. Specifically, no fracture, subluxation, or dislocation. Soft tissues are intact. No joint effusion. IMPRESSION: No acute bony abnormality. Electronically Signed   By: Charlett NoseKevin  Dover M.D.   On: 10/13/2016 13:12   Dg Knee Complete 4 Views Right  Result Date: 10/13/2016 CLINICAL DATA:  Fall at work this morning.  Bilateral knee pain EXAM: RIGHT KNEE - COMPLETE 4+ VIEW COMPARISON:  None. FINDINGS: Mild degenerative changes with joint space narrowing and spurring, best seen in the patellofemoral compartment. Small joint effusion. No acute bony abnormality. Specifically, no fracture, subluxation, or dislocation. Soft tissues are intact. IMPRESSION: Mild degenerative changes. Small joint effusion. No acute bony abnormality. Electronically Signed   By: Charlett NoseKevin  Dover M.D.   On: 10/13/2016 13:13    Procedures Procedures (including critical care time)  Medications Ordered in ED Medications - No data to display   Initial  Impression / Assessment and Plan / ED Course  I have reviewed the triage vital signs and the nursing notes.  Pertinent labs & imaging results that were available during my care of the patient were reviewed by me and considered in my medical decision making (see chart for details).  Clinical Course    55 year old male with bilateral knee pain and right ankle pain after trauma. Xrays negative for acute pathology. Patient's pain is diffuse. Offered pain medication however patient declined stating he would like to get his meds through the [facility]. He states he will follow up with his doctor tomorrow. He is ambulatory but crutches provided for comfort. Patient is NAD, non-toxic, with stable VS. Patient is informed of clinical course, understands medical decision making process, and agrees with plan. Opportunity for questions provided and all questions answered. Return precautions given.   Final Clinical Impressions(s) / ED Diagnoses   Final diagnoses:  Acute pain of both knees  Sprain of left ankle, unspecified ligament, initial encounter  New Prescriptions New Prescriptions   No medications on file   I personally performed the services described in this documentation, which was scribed in my presence. The recorded information has been reviewed and is accurate.    Bethel Born, PA-C 10/14/16 1108    Shaune Pollack, MD 10/14/16 [phone]

## 2016-10-13 NOTE — Discharge Instructions (Signed)
Rest - rest as much as possible Ice - ice for 20 minutes at a time, several times a day Compression - wear brace to provide support Elevate - elevate legs above level of heart Ibuprofen - take with food. Take up to 3-4 times daily

## 2017-03-08 ENCOUNTER — Emergency Department (HOSPITAL_COMMUNITY)
Admission: EM | Admit: 2017-03-08 | Discharge: 2017-03-08 | Disposition: A | Discharge status: Home / Self Care | Payer: Non-veteran care | Attending: Emergency Medicine | Admitting: Emergency Medicine

## 2017-03-08 ENCOUNTER — Encounter (HOSPITAL_COMMUNITY): Payer: Self-pay

## 2017-03-08 ENCOUNTER — Emergency Department (HOSPITAL_COMMUNITY): Payer: Non-veteran care

## 2017-03-08 DIAGNOSIS — I10 Essential (primary) hypertension: Secondary | ICD-10-CM | POA: Diagnosis not present

## 2017-03-08 DIAGNOSIS — Z7984 Long term (current) use of oral hypoglycemic drugs: Secondary | ICD-10-CM | POA: Insufficient documentation

## 2017-03-08 DIAGNOSIS — M5418 Radiculopathy, sacral and sacrococcygeal region: Secondary | ICD-10-CM | POA: Insufficient documentation

## 2017-03-08 DIAGNOSIS — E119 Type 2 diabetes mellitus without complications: Secondary | ICD-10-CM | POA: Diagnosis not present

## 2017-03-08 DIAGNOSIS — R002 Palpitations: Secondary | ICD-10-CM | POA: Diagnosis present

## 2017-03-08 DIAGNOSIS — M5410 Radiculopathy, site unspecified: Secondary | ICD-10-CM

## 2017-03-08 LAB — BASIC METABOLIC PANEL
ANION GAP: 11 (ref 5–15)
BUN: 9 mg/dL (ref 6–20)
CALCIUM: 9.1 mg/dL (ref 8.9–10.3)
CO2: 23 mmol/L (ref 22–32)
Chloride: 101 mmol/L (ref 101–111)
Creatinine, Ser: 0.78 mg/dL (ref 0.61–1.24)
GFR calc Af Amer: >60 mL/min (ref >60)
Glucose, Bld: 290 mg/dL — ABNORMAL HIGH (ref 65–99)
POTASSIUM: 4.4 mmol/L (ref 3.5–5.1)
SODIUM: 135 mmol/L (ref 135–145)

## 2017-03-08 LAB — CBC
HEMATOCRIT: 38.8 % — AB (ref 39.0–52.0)
HEMOGLOBIN: 14.1 g/dL (ref 13.0–17.0)
MCH: 29.1 pg (ref 26.0–34.0)
MCHC: 36.3 g/dL — AB (ref 30.0–36.0)
MCV: 80.2 fL (ref 78.0–100.0)
Platelets: 280 10*3/uL (ref 150–400)
RBC: 4.84 MIL/uL (ref 4.22–5.81)
RDW: 14.2 % (ref 11.5–15.5)
WBC: 7.3 10*3/uL (ref 4.0–10.5)

## 2017-03-08 LAB — I-STAT TROPONIN, ED
TROPONIN I, POC: 0.01 ng/mL (ref 0.00–0.08)
Troponin i, poc: 0 ng/mL (ref 0.00–0.08)

## 2017-03-08 MED ORDER — IBUPROFEN 400 MG PO TABS
600.0000 mg | ORAL_TABLET | Freq: Once | ORAL | Status: DC
  Filled 2017-03-08: qty 1

## 2017-03-08 MED ORDER — CYCLOBENZAPRINE HCL 10 MG PO TABS
10.0000 mg | ORAL_TABLET | Freq: Once | ORAL | Status: DC
  Filled 2017-03-08: qty 1

## 2017-03-08 NOTE — ED Notes (Signed)
Patient transported to X-ray 
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
## 2017-03-08 NOTE — ED Notes (Signed)
Ramon DredgeHannah S, RN noticed pt had run of vtach on monitor at same time when he stated to her that he felt a vibration feeling in his chest. Dr. Particia NearingHaviland made aware and in to see patient.
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
## 2017-04-12 MED ORDER — IOPAMIDOL (ISOVUE-300) INJECTION 61%
INTRAVENOUS | Status: AC
  Filled 2017-04-12: qty 100

## 2020-01-08 ENCOUNTER — Ambulatory Visit: Payer: Non-veteran care | Attending: Internal Medicine

## 2020-01-08 DIAGNOSIS — Z20822 Contact with and (suspected) exposure to covid-19: Secondary | ICD-10-CM

## 2020-01-09 LAB — SARS-COV-2, NAA 2 DAY TAT

## 2020-01-09 LAB — NOVEL CORONAVIRUS, NAA: SARS-CoV-2, NAA: NOT DETECTED

## 2020-01-15 ENCOUNTER — Encounter (HOSPITAL_COMMUNITY): Payer: Self-pay | Admitting: Emergency Medicine

## 2020-01-15 ENCOUNTER — Inpatient Hospital Stay (HOSPITAL_COMMUNITY): Payer: No Typology Code available for payment source

## 2020-01-15 ENCOUNTER — Emergency Department (HOSPITAL_COMMUNITY)
Admit: 2020-01-15 | Discharge: 2020-01-15 | Disposition: A | Discharge status: Home / Self Care | Payer: No Typology Code available for payment source | Attending: Emergency Medicine | Admitting: Emergency Medicine

## 2020-01-15 ENCOUNTER — Inpatient Hospital Stay (HOSPITAL_COMMUNITY)
Admission: EM | Admit: 2020-01-15 | Discharge: 2020-01-16 | DRG: 176 | Disposition: A | Discharge status: Home / Self Care | Payer: No Typology Code available for payment source | Attending: Family Medicine | Admitting: Family Medicine

## 2020-01-15 ENCOUNTER — Telehealth (HOSPITAL_COMMUNITY): Payer: Self-pay | Admitting: Emergency Medicine

## 2020-01-15 ENCOUNTER — Emergency Department (HOSPITAL_COMMUNITY): Payer: No Typology Code available for payment source

## 2020-01-15 ENCOUNTER — Other Ambulatory Visit: Payer: Self-pay

## 2020-01-15 DIAGNOSIS — I82412 Acute embolism and thrombosis of left femoral vein: Secondary | ICD-10-CM | POA: Diagnosis present

## 2020-01-15 DIAGNOSIS — I10 Essential (primary) hypertension: Secondary | ICD-10-CM | POA: Diagnosis present

## 2020-01-15 DIAGNOSIS — I82432 Acute embolism and thrombosis of left popliteal vein: Secondary | ICD-10-CM | POA: Diagnosis present

## 2020-01-15 DIAGNOSIS — M7989 Other specified soft tissue disorders: Secondary | ICD-10-CM

## 2020-01-15 DIAGNOSIS — Z6835 Body mass index (BMI) 35.0-35.9, adult: Secondary | ICD-10-CM | POA: Diagnosis not present

## 2020-01-15 DIAGNOSIS — I2602 Saddle embolus of pulmonary artery with acute cor pulmonale: Secondary | ICD-10-CM

## 2020-01-15 DIAGNOSIS — E785 Hyperlipidemia, unspecified: Secondary | ICD-10-CM | POA: Diagnosis present

## 2020-01-15 DIAGNOSIS — I088 Other rheumatic multiple valve diseases: Secondary | ICD-10-CM | POA: Diagnosis present

## 2020-01-15 DIAGNOSIS — I82812 Embolism and thrombosis of superficial veins of left lower extremities: Secondary | ICD-10-CM | POA: Diagnosis present

## 2020-01-15 DIAGNOSIS — I313 Pericardial effusion (noninflammatory): Secondary | ICD-10-CM | POA: Diagnosis present

## 2020-01-15 DIAGNOSIS — Z791 Long term (current) use of non-steroidal anti-inflammatories (NSAID): Secondary | ICD-10-CM

## 2020-01-15 DIAGNOSIS — Z7984 Long term (current) use of oral hypoglycemic drugs: Secondary | ICD-10-CM

## 2020-01-15 DIAGNOSIS — Z79899 Other long term (current) drug therapy: Secondary | ICD-10-CM | POA: Diagnosis not present

## 2020-01-15 DIAGNOSIS — Z20822 Contact with and (suspected) exposure to covid-19: Secondary | ICD-10-CM | POA: Diagnosis present

## 2020-01-15 DIAGNOSIS — I82452 Acute embolism and thrombosis of left peroneal vein: Secondary | ICD-10-CM | POA: Diagnosis present

## 2020-01-15 DIAGNOSIS — I2699 Other pulmonary embolism without acute cor pulmonale: Principal | ICD-10-CM

## 2020-01-15 DIAGNOSIS — E669 Obesity, unspecified: Secondary | ICD-10-CM | POA: Diagnosis present

## 2020-01-15 DIAGNOSIS — E119 Type 2 diabetes mellitus without complications: Secondary | ICD-10-CM | POA: Diagnosis present

## 2020-01-15 DIAGNOSIS — I82462 Acute embolism and thrombosis of left calf muscular vein: Secondary | ICD-10-CM | POA: Diagnosis present

## 2020-01-15 DIAGNOSIS — R0602 Shortness of breath: Secondary | ICD-10-CM | POA: Diagnosis present

## 2020-01-15 DIAGNOSIS — I82442 Acute embolism and thrombosis of left tibial vein: Secondary | ICD-10-CM | POA: Diagnosis present

## 2020-01-15 DIAGNOSIS — I2694 Multiple subsegmental pulmonary emboli without acute cor pulmonale: Secondary | ICD-10-CM | POA: Diagnosis not present

## 2020-01-15 LAB — BASIC METABOLIC PANEL
Anion gap: 13 (ref 5–15)
BUN: 7 mg/dL (ref 6–20)
CO2: 24 mmol/L (ref 22–32)
Calcium: 9.2 mg/dL (ref 8.9–10.3)
Chloride: 101 mmol/L (ref 98–111)
Creatinine, Ser: 0.78 mg/dL (ref 0.61–1.24)
GFR calc Af Amer: >60 mL/min (ref >60)
GFR calc non Af Amer: >60 mL/min (ref >60)
Glucose, Bld: 162 mg/dL — ABNORMAL HIGH (ref 70–99)
Potassium: 4.8 mmol/L (ref 3.5–5.1)
Sodium: 138 mmol/L (ref 135–145)

## 2020-01-15 LAB — CBC
HCT: 42.5 % (ref 39.0–52.0)
Hemoglobin: 15 g/dL (ref 13.0–17.0)
MCH: 29.1 pg (ref 26.0–34.0)
MCHC: 35.3 g/dL (ref 30.0–36.0)
MCV: 82.4 fL (ref 80.0–100.0)
Platelets: 224 10*3/uL (ref 150–400)
RBC: 5.16 MIL/uL (ref 4.22–5.81)
RDW: 14.7 % (ref 11.5–15.5)
WBC: 9.9 10*3/uL (ref 4.0–10.5)
nRBC: 0 % (ref 0.0–0.2)

## 2020-01-15 LAB — HEPARIN LEVEL (UNFRACTIONATED): Heparin Unfractionated: 0.59 IU/mL (ref 0.30–0.70)

## 2020-01-15 LAB — HEMOGLOBIN A1C
Hgb A1c MFr Bld: 8.2 % — ABNORMAL HIGH (ref 4.8–5.6)
Mean Plasma Glucose: 188.64 mg/dL

## 2020-01-15 LAB — BRAIN NATRIURETIC PEPTIDE: B Natriuretic Peptide: 104.9 pg/mL — ABNORMAL HIGH (ref 0.0–100.0)

## 2020-01-15 LAB — ECHOCARDIOGRAM COMPLETE
Height: 74 in
Weight: 4400 oz

## 2020-01-15 LAB — GLUCOSE, CAPILLARY
Glucose-Capillary: 136 mg/dL — ABNORMAL HIGH (ref 70–99)
Glucose-Capillary: 127 mg/dL — ABNORMAL HIGH (ref 70–99)

## 2020-01-15 LAB — SARS CORONAVIRUS 2 (TAT 6-24 HRS): SARS Coronavirus 2: NEGATIVE

## 2020-01-15 LAB — TROPONIN I (HIGH SENSITIVITY)
Troponin I (High Sensitivity): 16 ng/L (ref <18)
Troponin I (High Sensitivity): 11 ng/L (ref <18)

## 2020-01-15 MED ORDER — SODIUM CHLORIDE 0.9% FLUSH: 3.0000 mL | Freq: Once | INTRAVENOUS | Status: DC

## 2020-01-15 MED ORDER — HEPARIN BOLUS VIA INFUSION
6000.0000 [IU] | Freq: Once | INTRAVENOUS | Status: AC
  Administered 2020-01-15: 6000 [IU] via INTRAVENOUS
  Filled 2020-01-15: qty 6000

## 2020-01-15 MED ORDER — IOHEXOL 350 MG/ML SOLN
150.0000 mL | Freq: Once | INTRAVENOUS | Status: AC | PRN
  Administered 2020-01-15: 125 mL via INTRAVENOUS

## 2020-01-15 MED ORDER — ACETAMINOPHEN 325 MG PO TABS
650.0000 mg | ORAL_TABLET | Freq: Four times a day (QID) | ORAL | Status: DC | PRN
  Administered 2020-01-15 – 2020-01-16 (×3): 650 mg via ORAL
  Filled 2020-01-15 (×3): qty 2

## 2020-01-15 MED ORDER — INSULIN ASPART 100 UNIT/ML ~~LOC~~ SOLN
0.0000 [IU] | Freq: Three times a day (TID) | SUBCUTANEOUS | Status: DC
  Administered 2020-01-15: 1 [IU] via SUBCUTANEOUS
  Administered 2020-01-16: 2 [IU] via SUBCUTANEOUS

## 2020-01-15 MED ORDER — HEPARIN (PORCINE) 25000 UT/250ML-% IV SOLN
1800.0000 [IU]/h | INTRAVENOUS | Status: DC
  Administered 2020-01-15: 1800 [IU]/h via INTRAVENOUS
  Filled 2020-01-15 (×2): qty 250

## 2020-01-15 NOTE — H&P (Addendum)
[facility] Admission History and Physical Service Pager: [phone]  Patient name: STEPFON Anderson Medical record number: [id number] Date of birth: 1961-01-07 Age: 59 y.o. Gender: male  Primary Care Provider: System, [place] [facility] [place] Consultants: IR, Pulmonology Code Status: DNI, confirmed on admission, he is agreeable to blood transfusions should they be needed Preferred Emergency Contact: Wife: Rick Anderson [phone]  Chief Complaint: Shortness of breath  Assessment and Plan: Rick Anderson is a 59 y.o. male presenting with shortness of breath and leg swelling. PMH is significant for Type 2 DM, hypertension and hyperlipidemia.  Unprovoked,Subacute Submassive Pulmonary Embolism with Left DVT  Rick Anderson is a 59 year old male who presents today with 2 week history of shortness of breath and left-sided calf swelling. Patient denies risk factors for DVT/PE such as recent travel, smoking, immobilization and coagulation disorders. In the ED,  BP 136/84, RR 19, sats 99% on RA and HR 82. CBC, CMP wnl. Trop 16, 11. CXR: no cardiopulmonary disease. LE dopplers showed DVT in left femoral vein, left popliteal vein, left posterior tibial veins, left peroneal veins, and left gastrocnemius veins. Also superficial vein thrombosis involving the left small saphenous vein. CTA with large burden of embolus present bilaterally, in distal left and right pulmonary arteries as well as lobar and more distal branch vessels, RV LV ratio to 1.1 and enlargement of main pulmonary artery, concern for right heart strain. PESI score 70. Does not meet massive PE criteria given lack of hypotension - BP 159/96 on presentation.  He is hemodynamically stable but is being treated for submassive PE. Has received heparin bolus 6000 units IV and will continue on heparin gtt. EKG: SR. Echo is pending to assess for right ventricular strain. Per IR and pulmonology does not require thrombolysis therapy,  if evidence of right ventricular strain on echo then would consider this, if no concerns then will change to [place] for at least 6 months. -Admit to progressive, Attending Dr. Erin Hearing -Continuous pulse oximetry and telemetry -Monitor vital signs closely for hemodynamic stability -Up with assistance -IR following, appreciate recommendations: No indication for any type of thrombolysis procedure at this time but will follow peripherally -Pulm/Criticalcare following, appreciate recommendations: As above, on discharge hypercoagulable panel several months after he is off anticoagulation. -Continue heparin gtt 1000 units/hour -Anticoagulation per pharmacy -Daily heparin levels and CBC -Follow-up echo -OT PT eval  Type 2 Diabetes CBG 261 on admission.  No A1c on file Home meds: Jardiance 12.5mg  daily, glipizide 20 mg BID, Metformin 1000 mg BID  -Carb modified diet -Hold home medications -Sensitive sliding scale -CBGs with meals and at bedtime -Follow-up A1c  Hypertension  SBP 124-159, DBP 87-96 Home meds: Lisinopril 10 mg daily -Hold lisinopril as to not add to possible development of hypotension in the setting of submassive PE -Monitor BPs  Hyperlipidemia No lipid panel on file  Home meds: Atorvastatin 20 mg daily, patient is not taking this per med rec's -Consider restarting on discharge, 40 mg atorvastatin  FEN/GI: Carb modified diet Prophylaxis: Heparin infusion  Disposition: admit to inpatient  History of Present Illness:  Rick Anderson is a 60 y.o. male presenting with shortness of breath and left leg.   Patient first noticed that he was having shortness of breath while working around the house which started about 2 weeks ago. He noticed this when he was at the [facility] for an appointment, he walked in, had a lot of shortness of breath, and was really sweaty.  He stated that his breathing had gotten a little better over the last few days, but is winded after taking 10-15  steps.  Around the same time, started to notice that his left leg was larger than the right and he was having pain with walking.  Stated that he decided to come to the ED today because his left leg continued to hurt and he was unable to walk.  Denies any history of PE, DVT, or other clotting issue.  No recent long flights, never smoked,   He reports that his mother had a leg issue as well and had am amputation, but isn't sure if it was a clot.  No known history of a clotting disorder. He denies recent weight loss.  Has been having diaphoresis with shortness of breath, but not particularly night sweats.  Review Of Systems: Per HPI with the following additions:   Review of Systems  Constitutional: Positive for diaphoresis (when having shortness of breath). Negative for chills, fever and weight loss.  Eyes: Negative for blurred vision.  Respiratory: Positive for shortness of breath.   Cardiovascular: Positive for chest pain, palpitations and leg swelling.  Gastrointestinal: Negative for abdominal pain, blood in stool, constipation, diarrhea, melena, nausea and vomiting.  Genitourinary: Negative for dysuria, frequency, hematuria and urgency.  Neurological: Positive for dizziness. Negative for loss of consciousness and headaches.    Patient Active Problem List   Diagnosis Date Noted  . Diabetes type 2, controlled (HCC) 11/07/2015    Past Medical History: Past Medical History:  Diagnosis Date  . Diabetes mellitus without complication (HCC)   . High cholesterol   . Hypertension     Past Surgical History: Past Surgical History:  Procedure Laterality Date  . HAND TENDON SURGERY      Social History: Social History   Tobacco Use  . Smoking status: Never Smoker  . Smokeless tobacco: Never Used  Substance Use Topics  . Alcohol use: No  . Drug use: No   Additional social history: Works as a Education officer, environmental  Please also refer to relevant sections of EMR.  Family History: No family history  on file. No family history of clotting disorders   Allergies and Medications:  No current facility-administered medications on file prior to encounter.   Current Outpatient Medications on File Prior to Encounter  Medication Sig Dispense Refill  . Ascorbic Acid (VITAMIN C) 1000 MG tablet Take 1,000 mg by mouth daily.    . Echinacea-Goldenseal (ECHINACEA COMB/GOLDEN SEAL PO) Take 1 tablet by mouth daily.    . empagliflozin (JARDIANCE) 25 MG TABS tablet Take 12.5 mg by mouth daily.    Marland Kitchen glipiZIDE (GLUCOTROL) 10 MG tablet Take 20 mg by mouth 2 (two) times daily before a meal.     . ibuprofen (ADVIL) 600 MG tablet Take 600 mg by mouth 3 (three) times daily.    Marland Kitchen lisinopril (ZESTRIL) 20 MG tablet Take 10 mg by mouth daily.     . metFORMIN (GLUCOPHAGE) 500 MG tablet Take 1,000 mg by mouth 2 (two) times daily.    . Multiple Vitamins-Minerals (CENTRUM SILVER ADULT 50+ PO) Take 1 tablet by mouth daily.    Marland Kitchen atorvastatin (LIPITOR) 40 MG tablet Take 20 mg by mouth daily.    Marland Kitchen oxyCODONE-acetaminophen (PERCOCET/ROXICET) 5-325 MG tablet Take 1-2 tablets by mouth every 6 (six) hours as needed for moderate pain or severe pain. (Patient not taking: Reported on 01/15/2020) 12 tablet 0    Objective: BP 136/84   Pulse 82  Temp 98.5 F (36.9 C) (Oral)   Resp 19   Ht 6\' 2"  (1.88 m)   Wt 124.7 kg   SpO2 99%   BMI 35.31 kg/m   Exam: General: Well appearing 59 yr old male, pleasant, obese, no acute distress Eyes: No scleral icterus, normal extraocular eye movements ENTM: Pharyngeal edema or exudates, no thyromegaly or lymphadenopathy Neck: Supple, normal range of movement Cardiovascular: S1 and S2, RRR, warm and well-perfused Respiratory: CTAB, normal WOB Gastrointestinal: Abdomen soft non-tender, bowel sounds present MSK: Moving all 4 limbs equally, left calf swelling L>R, 1+ pitting edema up to knees in LE Derm: Warm and dry Neuro: Cranial nerves grossly intact Psych: Normal mood, normal  affect  Labs and Imaging: CBC BMET  Recent Labs  Lab 01/15/20 0827  WBC 9.9  HGB 15.0  HCT 42.5  PLT 224   Recent Labs  Lab 01/15/20 0827  NA 138  K 4.8  CL 101  CO2 24  BUN 7  CREATININE 0.78  GLUCOSE 162*  CALCIUM 9.2     EKG: SR  01/17/20, MD 01/15/2020, 4:42 PM PGY,1, [facility] FPTS Intern pager: [phone], text pages welcome  FPTS Upper-Level Resident Addendum   I have independently interviewed and examined the patient. I have discussed the above with the original author and agree with their documentation. My edits for correction/addition/clarification are in green. Please see also any attending notes.   [phone], D.O. PGY-2, [facility] 01/15/2020 10:07 PM  FPTS Service pager: [phone] (text pages welcome through [facility])

## 2020-01-15 NOTE — Telephone Encounter (Signed)
Error opening encounter

## 2020-01-15 NOTE — ED Triage Notes (Signed)
Pt here with c/o sob while walking and having  To sleep sitting up , pt also has some swelling to the left leg, also with some fluttering in his chest

## 2020-01-15 NOTE — Progress Notes (Signed)
Left lower extremity venous duplex completed. Refer to "CV Proc" under chart review to view preliminary results.  Preliminary results discussed with Ebbie Ridge, PA-C.  01/15/2020 9:58 AM Eula Fried., MHA, RVT, RDCS, RDMS

## 2020-01-15 NOTE — Progress Notes (Signed)
ANTICOAGULATION CONSULT NOTE - Follow Up Consult  Pharmacy Consult for heparin Indication: DVT + PE   Patient Measurements: Height: 6\' 2"  (188 cm) Weight: 274 lb 14.4 oz (124.7 kg) IBW/kg (Calculated) : 82.2 Heparin Dosing Weight: 109.3kg  Vital Signs: Temp: 97.3 F (36.3 C) (03/31 1943) Temp Source: Oral (03/31 1943) BP: 121/71 (03/31 1943) Pulse Rate: 89 (03/31 1945)  Labs: Recent Labs    01/15/20 0827 01/15/20 1017 01/15/20 1930  HGB 15.0  --   --   HCT 42.5  --   --   PLT 224  --   --   HEPARINUNFRC  --   --  0.59  CREATININE 0.78  --   --   TROPONINIHS 16 11  --     Estimated Creatinine Clearance: 137.8 mL/min (by C-G formula based on SCr of 0.78 mg/dL).   Medical History: Past Medical History:  Diagnosis Date  . Diabetes mellitus without complication (HCC)   . High cholesterol   . Hypertension     Medications:  Infusions:  . heparin 1,800 Units/hr (01/15/20 1323)    Assessment: 58 yom presented to the ED with SOB and leg swelling. Found to have a DVT. CT chest positive for bilateral PE with mild heart strain. Started on IV heparin. Baseline CBC is WNL. He is not on anticoagulation PTA.  Initial HL is therapeutic on 1800 units/hr. RN reports no s/s of bleeding.    Goal of Therapy:  Heparin level 0.3-0.7 units/ml Monitor platelets by anticoagulation protocol: Yes   Plan:  Continue heparin gtt 1800 units/hr Check a 6 hr heparin level Daily heparin level and CBC  01/17/20, PharmD., BCPS Clinical Pharmacist Clinical phone for 01/15/20 until 10pm: [phone] If after 10pm, please refer to [facility] for unit-specific pharmacist

## 2020-01-15 NOTE — ED Provider Notes (Signed)
[facility] EMERGENCY DEPARTMENT Provider Note   CSN: [id number] Arrival date & time: 01/15/20  6767     History Chief Complaint  Patient presents with  . Shortness of Breath  . Leg Swelling    Rick Anderson is a 59 y.o. male.  HPI Patient presents to the emergency department with leg swelling on the left over the last week.  Patient states it may have started before the exit with having some mild discomfort.  The patient states that he has had some shortness of breath over the last few days.  The patient states that the leg pain got worse over the last week.  Patient states he did not take any medications prior to arrival for symptoms.  Patient states that the pain seemed to get worse and so brought him to the emergency department.  The patient denies chest pain headache,blurred vision, neck pain, fever, cough, weakness, numbness, dizziness, anorexia, edema, abdominal pain, nausea, vomiting, diarrhea, rash, back pain, dysuria, hematemesis, bloody stool, near syncope, or syncope.    Past Medical History:  Diagnosis Date  . Diabetes mellitus without complication (HCC)   . High cholesterol   . Hypertension     Patient Active Problem List   Diagnosis Date Noted  . Diabetes type 2, controlled (HCC) 11/07/2015    Past Surgical History:  Procedure Laterality Date  . HAND TENDON SURGERY         No family history on file.  Social History   Tobacco Use  . Smoking status: Never Smoker  . Smokeless tobacco: Never Used  Substance Use Topics  . Alcohol use: No  . Drug use: No    Home Medications Prior to Admission medications   Medication Sig Start Date End Date Taking? Authorizing Provider  Ascorbic Acid (VITAMIN C) 1000 MG tablet Take 1,000 mg by mouth daily.   Yes [provider]  Echinacea-Goldenseal (ECHINACEA COMB/GOLDEN SEAL PO) Take 1 tablet by mouth daily.   Yes [provider]  empagliflozin (JARDIANCE) 25 MG TABS tablet Take  12.5 mg by mouth daily.   Yes [provider]  glipiZIDE (GLUCOTROL) 10 MG tablet Take 20 mg by mouth 2 (two) times daily before a meal.    Yes [provider]  ibuprofen (ADVIL) 600 MG tablet Take 600 mg by mouth 3 (three) times daily.   Yes [provider]  lisinopril (ZESTRIL) 20 MG tablet Take 10 mg by mouth daily.    Yes [provider]  metFORMIN (GLUCOPHAGE) 500 MG tablet Take 1,000 mg by mouth 2 (two) times daily.   Yes [provider]  Multiple Vitamins-Minerals (CENTRUM SILVER ADULT 50+ PO) Take 1 tablet by mouth daily.   Yes [provider]  atorvastatin (LIPITOR) 40 MG tablet Take 20 mg by mouth daily.    [provider]  oxyCODONE-acetaminophen (PERCOCET/ROXICET) 5-325 MG tablet Take 1-2 tablets by mouth every 6 (six) hours as needed for moderate pain or severe pain. Patient not taking: Reported on 01/15/2020 02/29/16   Leta Baptist, MD    Allergies    Amoxicillin and Ampicillin  Review of Systems   Review of Systems All other systems negative except as documented in the HPI. All pertinent positives and negatives as reviewed in the HPI. Physical Exam Updated Vital Signs BP 136/84   Pulse 82   Temp 98.5 F (36.9 C) (Oral)   Resp 19   Ht 6\' 2"  (1.88 m)   Wt 124.7 kg  SpO2 99%   BMI 35.31 kg/m   Physical Exam Vitals and nursing note reviewed.  Constitutional:      General: He is not in acute distress.    Appearance: He is well-developed.  HENT:     Head: Normocephalic and atraumatic.  Eyes:     Pupils: Pupils are equal, round, and reactive to light.  Cardiovascular:     Rate and Rhythm: Normal rate and regular rhythm.     Heart sounds: Normal heart sounds. No murmur. No friction rub. No gallop.   Pulmonary:     Effort: Pulmonary effort is normal. No respiratory distress.     Breath sounds: Normal breath sounds. No wheezing, rhonchi or rales.  Abdominal:     General: Bowel sounds are normal.  There is no distension.     Palpations: Abdomen is soft.     Tenderness: There is no abdominal tenderness.  Musculoskeletal:     Cervical back: Normal range of motion and neck supple.  Skin:    General: Skin is warm and dry.     Capillary Refill: Capillary refill takes less than 2 seconds.     Findings: No erythema or rash.  Neurological:     Mental Status: He is alert and oriented to person, place, and time.     Motor: No abnormal muscle tone.     Coordination: Coordination normal.  Psychiatric:        Behavior: Behavior normal.     ED Results / Procedures / Treatments   Labs (all labs ordered are listed, but only abnormal results are displayed) Labs Reviewed  BASIC METABOLIC PANEL - Abnormal; Notable for the following components:      Result Value   Glucose, Bld 162 (*)    All other components within normal limits  SARS CORONAVIRUS 2 (TAT 6-24 HRS)  CBC  HEPARIN LEVEL (UNFRACTIONATED)  BRAIN NATRIURETIC PEPTIDE  TROPONIN I (HIGH SENSITIVITY)  TROPONIN I (HIGH SENSITIVITY)    EKG EKG Interpretation  Date/Time:  Wednesday January 15 2020 08:11:08 EDT Ventricular Rate:  96 PR Interval:  170 QRS Duration: 92 QT Interval:  372 QTC Calculation: 469 R Axis:   8 Text Interpretation: Normal sinus rhythm T wave abnormality, consider inferior ischemia Prolonged QT Abnormal ECG Confirmed by Virgina Norfolk [phone]) on 01/15/2020 11:54:20 AM   Radiology DG Chest 2 View  Result Date: 01/15/2020 CLINICAL DATA:  Short of breath with leg swelling. EXAM: CHEST - 2 VIEW COMPARISON:  03/08/2017 FINDINGS: The heart size and mediastinal contours are within normal limits. Both lungs are clear. Unchanged scarring in the left lower lobe. The visualized skeletal structures are unremarkable. IMPRESSION: No active cardiopulmonary disease. Electronically Signed   By: Signa Kell M.D.   On: 01/15/2020 08:54   CT Angio Chest PE W/Cm &/Or Wo Cm  Result Date: 01/15/2020 CLINICAL DATA:  DVT,  evaluate for PE EXAM: CT ANGIOGRAPHY CHEST WITH CONTRAST TECHNIQUE: Multidetector CT imaging of the chest was performed using the standard protocol during bolus administration of intravenous contrast. Multiplanar CT image reconstructions and MIPs were obtained to evaluate the vascular anatomy. CONTRAST:  OMNIPAQUE IOHEXOL 350 MG/ML SOLN COMPARISON:  None. FINDINGS: Cardiovascular: Satisfactory opacification of the pulmonary arteries to the segmental level. Positive examination for pulmonary embolism. Large burden of embolus present bilaterally, in the distal left and right pulmonary arteries as well as the lobar and more distal branch vessels. Mild cardiomegaly. Enlargement of the RV LV ratio to 1.1. Enlargement of the main pulmonary artery  up to 3.4 cm. Three-vessel coronary artery calcifications. Trace pericardial effusion. Mediastinum/Nodes: No enlarged mediastinal, hilar, or axillary lymph nodes. Thyroid gland, trachea, and esophagus demonstrate no significant findings. Lungs/Pleura: Mosaic attenuation of the airspaces, which may be due to variable hypoperfusion in the setting of embolus or alternately small airways disease. No pleural effusion or pneumothorax. Upper Abdomen: No acute abnormality. Musculoskeletal: No chest wall abnormality. No acute or significant osseous findings. Review of the MIP images confirms the above findings. IMPRESSION: 1. Positive examination for pulmonary embolism. Large burden of embolus present bilaterally, in the distal left and right pulmonary arteries as well as the lobar and more distal branch vessels. 2. Mild cardiomegaly with enlargement of the RV LV ratio to 1.1 and enlargement of the main pulmonary artery up to 3.4 cm, findings concerning for massive pulmonary embolism and right heart strain. 3. Mosaic attenuation of the airspaces, which may be due to variable hypoperfusion in the setting of embolus or alternately small airways disease. 4.  Coronary artery disease.  These results were called by telephone at the time of interpretation on 01/15/2020 at 1:31 pm to PA [facility]RISTOP[facility] Jameer Storie , who verbally acknowledged these results. Electronically Signed   By: Lauralyn PrimesAlex  Bibbey M.D.   On: 01/15/2020 13:31   VAS US LOWER EXTREMITY VENOUS (DVT) (ONLY [facility] & [facility] 7a-7p)  Result Date: 01/15/2020  Lower Venous DVTStudy Indications: Swelling, and Palpable Cord.  Comparison Study: No prior study Performing Technologist: Gertie FeyMichelle Simonetti MHA, RDMS, RVT, RDCS  Examination Guidelines: A complete evaluation includes B-mode imaging, spectral Doppler, color Doppler, and power Doppler as needed of all accessible portions of each vessel. Bilateral testing is considered an integral part of a complete examination. Limited examinations for reoccurring indications may be performed as noted. The reflux portion of the exam is performed with the patient in reverse Trendelenburg.  +-----+---------------+---------+-----------+----------+--------------+ RIGHTCompressibilityPhasicitySpontaneityPropertiesThrombus Aging +-----+---------------+---------+-----------+----------+--------------+ CFV  Full           Yes      Yes                                 +-----+---------------+---------+-----------+----------+--------------+   +---------+---------------+---------+-----------+----------+--------------+ LEFT     CompressibilityPhasicitySpontaneityPropertiesThrombus Aging +---------+---------------+---------+-----------+----------+--------------+ CFV      Full           Yes      Yes                                 +---------+---------------+---------+-----------+----------+--------------+ SFJ      Full                                                        +---------+---------------+---------+-----------+----------+--------------+ FV Prox  None                    No                   Acute          +---------+---------------+---------+-----------+----------+--------------+ FV  Mid   None  Acute          +---------+---------------+---------+-----------+----------+--------------+ FV DistalNone                                         Acute          +---------+---------------+---------+-----------+----------+--------------+ PFV      Full                                                        +---------+---------------+---------+-----------+----------+--------------+ POP      None                    No                   Acute          +---------+---------------+---------+-----------+----------+--------------+ PTV      None                    No                   Acute          +---------+---------------+---------+-----------+----------+--------------+ PERO     None                    No                   Acute          +---------+---------------+---------+-----------+----------+--------------+ Gastroc  None                    No                   Acute          +---------+---------------+---------+-----------+----------+--------------+ GSV      Full                                                        +---------+---------------+---------+-----------+----------+--------------+ SSV      None                    No                   Acute          +---------+---------------+---------+-----------+----------+--------------+     Summary: RIGHT: - No evidence of common femoral vein obstruction.  LEFT: - Findings consistent with acute deep vein thrombosis involving the left femoral vein, left popliteal vein, left posterior tibial veins, left peroneal veins, and left gastrocnemius veins. - Findings consistent with acute superficial vein thrombosis involving the left small saphenous vein.  *See table(s) above for measurements and observations.    Preliminary    CT Angio Abd/Pel w/ and/or w/o  Result Date: 01/15/2020 CLINICAL DATA:  Lower extremity DVT, evaluate for central extent EXAM: CTA ABDOMEN  AND PELVIS WITHOUT AND WITH CONTRAST TECHNIQUE: Multidetector CT imaging of the abdomen and pelvis was performed using the standard protocol during bolus administration of intravenous contrast. Multiplanar reconstructed images and MIPs were obtained and reviewed to evaluate the  vascular anatomy. CONTRAST:  182mL OMNIPAQUE IOHEXOL 350 MG/ML SOLN COMPARISON:  02/29/2016 FINDINGS: VASCULAR Aortic atherosclerosis. Standard branching pattern of the abdominal aorta. Normal opacification of the central veins without filling defects suspicious for thrombus in the IVC or iliac vessels. Review of the MIP images confirms the above findings. NON-VASCULAR Lower chest: No acute abnormality. Hepatobiliary: No focal liver abnormality is seen. No gallstones, gallbladder wall thickening, or biliary dilatation. Pancreas: Unremarkable. No pancreatic ductal dilatation or surrounding inflammatory changes. Spleen: Normal in size without focal abnormality. Adrenals/Urinary Tract: Adrenal glands are unremarkable. Unchanged intermediate attenuation hemorrhagic or proteinaceous cysts of the superior pole of the left kidney and inferior pole of the right kidney. Kidneys are otherwise normal, without renal calculi, focal lesion, or hydronephrosis. Bladder is unremarkable. Stomach/Bowel: Stomach is within normal limits. Appendix appears normal. No evidence of bowel wall thickening, distention, or inflammatory changes. Lymphatic: No enlarged abdominal or pelvic lymph nodes. Reproductive: Prostate is unremarkable. Other: No abdominal wall hernia or abnormality. No abdominopelvic ascites. Musculoskeletal: No acute or significant osseous findings. IMPRESSION: 1.  No evidence of central venous thrombus. 2. Aortic atherosclerosis without aneurysm or acute findings on this non tailored examination. Aortic Atherosclerosis (ICD10-I70.0). Electronically Signed   By: Eddie Candle M.D.   On: 01/15/2020 13:43    Procedures Procedures (including critical  care time)  Medications Ordered in ED Medications  sodium chloride flush (NS) 0.9 % injection 3 mL (3 mLs Intravenous Not Given 01/15/20 0843)  heparin ADULT infusion 100 units/mL (25000 units/255mL sodium chloride 0.45%) (1,800 Units/hr Intravenous New Bag/Given 01/15/20 1318)  heparin bolus via infusion 6,000 Units (6,000 Units Intravenous Bolus from Bag 01/15/20 1320)  iohexol (OMNIPAQUE) 350 MG/ML injection 150 mL (125 mLs Intravenous Contrast Given 01/15/20 1316)    ED Course  I have reviewed the triage vital signs and the nursing notes.  Pertinent labs & imaging results that were available during my care of the patient were reviewed by me and considered in my medical decision making (see chart for details).    MDM Rules/Calculators/A&P                      Patient will need to be admitted for further evaluation and care of his DVT and pulmonary embolus.  I did speak with interventional radiology about the DVT and I spoke with critical care about the PEs.  The patient will be admitted by the internal medicine residents.  Patient has been stable here in the emergency department and has had no signs of significant distress or vital sign abnormality.  Patient was started on heparin as well. Final Clinical Impression(s) / ED Diagnoses Final diagnoses:  PE (pulmonary thromboembolism) ([place])  Acute deep vein thrombosis (DVT) of femoral vein of left lower extremity [facility])    Rx / DC Orders ED Discharge Orders    None       Dalia Heading, PA-C 01/18/20 [place], [place], DO 01/20/20 1614

## 2020-01-15 NOTE — Consult Note (Signed)
Chief Complaint: Patient was seen in consultation today for consideration for Pulmonary thrombolysis and possible LLE DVT thrombolysis Chief Complaint  Patient presents with  . Shortness of Breath  . Leg Swelling   at the request of ED MD   Supervising Physician: Ruel Favors  Patient Status: [facility] - ED  History of Present Illness: Rick Anderson is a 59 y.o. male   Pt has noted exertional SOB for 2 weeks Regains breath when at rest. Nonsmoker Hx Truck driver until 2 yrs ago No Hx "blood clots" Occupation is Education officer, environmental now  Has also noted LLE swelling and pain below knee for 2 weeks No symptoms in Rt leg Can use Both legs-- but left definitely sore to walk and swelling seems to be worsening  Comes to ED for evaluation  Doppler: Summary:  RIGHT:  - No evidence of common femoral vein obstruction.  LEFT:  - Findings consistent with acute deep vein thrombosis involving the left  femoral vein, left popliteal vein, left posterior tibial veins, left  peroneal veins, and left gastrocnemius veins.  - Findings consistent with acute superficial vein thrombosis involving the  left small saphenous vein.   Troponin wnl  CTA Chest and abd/pelvis: IMPRESSION: 1. Positive examination for pulmonary embolism. Large burden of embolus present bilaterally, in the distal left and right pulmonary arteries as well as the lobar and more distal branch vessels. 2. Mild cardiomegaly with enlargement of the RV LV ratio to 1.1 and enlargement of the main pulmonary artery up to 3.4 cm, findings concerning for massive pulmonary embolism and right heart strain.  VASCULAR Aortic atherosclerosis. Standard branching pattern of the abdominal aorta. Normal opacification of the central veins without filling defects suspicious for thrombus in the IVC or iliac vessels. Review of the MIP images confirms the above findings.  I have seen and examined pt in ED Restful In NAD 99% 02- RA  No Echo for  review  Past Medical History:  Diagnosis Date  . Diabetes mellitus without complication (HCC)   . High cholesterol   . Hypertension     Past Surgical History:  Procedure Laterality Date  . HAND TENDON SURGERY      Allergies: Amoxicillin and Ampicillin  Medications: Prior to Admission medications   Medication Sig Start Date End Date Taking? Authorizing Provider  Ascorbic Acid (VITAMIN C) 1000 MG tablet Take 1,000 mg by mouth daily.   Yes [provider]  Echinacea-Goldenseal (ECHINACEA COMB/GOLDEN SEAL PO) Take 1 tablet by mouth daily.   Yes [provider]  empagliflozin (JARDIANCE) 25 MG TABS tablet Take 12.5 mg by mouth daily.   Yes [provider]  glipiZIDE (GLUCOTROL) 10 MG tablet Take 20 mg by mouth 2 (two) times daily before a meal.    Yes [provider]  ibuprofen (ADVIL) 600 MG tablet Take 600 mg by mouth 3 (three) times daily.   Yes [provider]  lisinopril (ZESTRIL) 20 MG tablet Take 10 mg by mouth daily.    Yes [provider]  metFORMIN (GLUCOPHAGE) 500 MG tablet Take 1,000 mg by mouth 2 (two) times daily.   Yes [provider]  Multiple Vitamins-Minerals (CENTRUM SILVER ADULT 50+ PO) Take 1 tablet by mouth daily.   Yes [provider]  atorvastatin (LIPITOR) 40 MG tablet Take 20 mg by mouth daily.    [provider]  oxyCODONE-acetaminophen (PERCOCET/ROXICET) 5-325 MG tablet Take 1-2 tablets by mouth every 6 (six) hours as needed for moderate pain or severe  pain. Patient not taking: Reported on 01/15/2020 02/29/16   Leta Baptist, MD     No family history on file.  Social History   Socioeconomic History  . Marital status: Married    Spouse name: Not on file  . Number of children: Not on file  . Years of education: Not on file  . Highest education level: Not on file  Occupational History  . Not on file  Tobacco Use  . Smoking status: Never Smoker  . Smokeless tobacco:  Never Used  Substance and Sexual Activity  . Alcohol use: No  . Drug use: No  . Sexual activity: Not on file  Other Topics Concern  . Not on file  Social History Narrative  . Not on file   Social Determinants of Health   Financial Resource Strain:   . Difficulty of Paying Living Expenses:   Food Insecurity:   . Worried About Programme researcher, broadcasting/film/video in the Last Year:   . Barista in the Last Year:   Transportation Needs:   . Freight forwarder (Medical):   Marland Kitchen Lack of Transportation (Non-Medical):   Physical Activity:   . Days of Exercise per Week:   . Minutes of Exercise per Session:   Stress:   . Feeling of Stress :   Social Connections:   . Frequency of Communication with Friends and Family:   . Frequency of Social Gatherings with Friends and Family:   . Attends Religious Services:   . Active Member of Clubs or Organizations:   . Attends Banker Meetings:   Marland Kitchen Marital Status:     Review of Systems: A 12 point ROS discussed and pertinent positives are indicated in the HPI above.  All other systems are negative.  Review of Systems  Constitutional: Positive for activity change. Negative for appetite change, fatigue and fever.  Respiratory: Positive for shortness of breath. Negative for cough.   Cardiovascular: Negative for chest pain.  Gastrointestinal: Negative for abdominal pain.  Musculoskeletal: Positive for gait problem.  Neurological: Negative for weakness.  Psychiatric/Behavioral: Negative for behavioral problems and confusion.    Vital Signs: BP 136/84   Pulse 82   Temp 98.5 F (36.9 C) (Oral)   Resp 19   Ht 6\' 2"  (1.88 m)   Wt 275 lb (124.7 kg)   SpO2 99%   BMI 35.31 kg/m   Physical Exam Vitals reviewed.  Constitutional:      Comments: Can carry conversation without SOB or distress  Cardiovascular:     Rate and Rhythm: Normal rate and regular rhythm.  Pulmonary:     Effort: Pulmonary effort is normal.     Breath sounds: Normal  breath sounds.  Abdominal:     General: Bowel sounds are normal.     Palpations: Abdomen is soft.  Musculoskeletal:     Right lower leg: No tenderness. No edema.     Left lower leg: Tenderness present. Edema present.     Comments: LLE is swollen and larger than right Sore to touch No redness Skin is wnl  Left thigh  Is wnl- NT and not swollen   Skin:    General: Skin is warm and dry.  Neurological:     Mental Status: He is alert and oriented to person, place, and time.  Psychiatric:        Behavior: Behavior normal.     Imaging: DG Chest 2 View  Result Date: 01/15/2020 CLINICAL DATA:  Short of  breath with leg swelling. EXAM: CHEST - 2 VIEW COMPARISON:  03/08/2017 FINDINGS: The heart size and mediastinal contours are within normal limits. Both lungs are clear. Unchanged scarring in the left lower lobe. The visualized skeletal structures are unremarkable. IMPRESSION: No active cardiopulmonary disease. Electronically Signed   By: Kerby Moors M.D.   On: 01/15/2020 08:54   CT Angio Chest PE W/Cm &/Or Wo Cm  Result Date: 01/15/2020 CLINICAL DATA:  DVT, evaluate for PE EXAM: CT ANGIOGRAPHY CHEST WITH CONTRAST TECHNIQUE: Multidetector CT imaging of the chest was performed using the standard protocol during bolus administration of intravenous contrast. Multiplanar CT image reconstructions and MIPs were obtained to evaluate the vascular anatomy. CONTRAST:  130mL OMNIPAQUE IOHEXOL 350 MG/ML SOLN COMPARISON:  None. FINDINGS: Cardiovascular: Satisfactory opacification of the pulmonary arteries to the segmental level. Positive examination for pulmonary embolism. Large burden of embolus present bilaterally, in the distal left and right pulmonary arteries as well as the lobar and more distal branch vessels. Mild cardiomegaly. Enlargement of the RV LV ratio to 1.1. Enlargement of the main pulmonary artery up to 3.4 cm. Three-vessel coronary artery calcifications. Trace pericardial effusion.  Mediastinum/Nodes: No enlarged mediastinal, hilar, or axillary lymph nodes. Thyroid gland, trachea, and esophagus demonstrate no significant findings. Lungs/Pleura: Mosaic attenuation of the airspaces, which may be due to variable hypoperfusion in the setting of embolus or alternately small airways disease. No pleural effusion or pneumothorax. Upper Abdomen: No acute abnormality. Musculoskeletal: No chest wall abnormality. No acute or significant osseous findings. Review of the MIP images confirms the above findings. IMPRESSION: 1. Positive examination for pulmonary embolism. Large burden of embolus present bilaterally, in the distal left and right pulmonary arteries as well as the lobar and more distal branch vessels. 2. Mild cardiomegaly with enlargement of the RV LV ratio to 1.1 and enlargement of the main pulmonary artery up to 3.4 cm, findings concerning for massive pulmonary embolism and right heart strain. 3. Mosaic attenuation of the airspaces, which may be due to variable hypoperfusion in the setting of embolus or alternately small airways disease. 4.  Coronary artery disease. These results were called by telephone at the time of interpretation on 01/15/2020 at 1:31 pm to [place] , who verbally acknowledged these results. Electronically Signed   By: Eddie Candle M.D.   On: 01/15/2020 13:31   VAS [place] LOWER EXTREMITY VENOUS (DVT) (ONLY [facility] & [facility] 7a-7p)  Result Date: 01/15/2020  Lower Venous DVTStudy Indications: Swelling, and Palpable Cord.  Comparison Study: No prior study Performing Technologist: Maudry Mayhew MHA, RDMS, RVT, RDCS  Examination Guidelines: A complete evaluation includes B-mode imaging, spectral Doppler, color Doppler, and power Doppler as needed of all accessible portions of each vessel. Bilateral testing is considered an integral part of a complete examination. Limited examinations for reoccurring indications may be performed as noted. The reflux portion of the exam is  performed with the patient in reverse Trendelenburg.  +-----+---------------+---------+-----------+----------+--------------+ RIGHTCompressibilityPhasicitySpontaneityPropertiesThrombus Aging +-----+---------------+---------+-----------+----------+--------------+ CFV  Full           Yes      Yes                                 +-----+---------------+---------+-----------+----------+--------------+   +---------+---------------+---------+-----------+----------+--------------+ LEFT     CompressibilityPhasicitySpontaneityPropertiesThrombus Aging +---------+---------------+---------+-----------+----------+--------------+ CFV      Full           Yes      Yes                                 +---------+---------------+---------+-----------+----------+--------------+  SFJ      Full                                                        +---------+---------------+---------+-----------+----------+--------------+ FV Prox  None                    No                   Acute          +---------+---------------+---------+-----------+----------+--------------+ FV Mid   None                                         Acute          +---------+---------------+---------+-----------+----------+--------------+ FV DistalNone                                         Acute          +---------+---------------+---------+-----------+----------+--------------+ PFV      Full                                                        +---------+---------------+---------+-----------+----------+--------------+ POP      None                    No                   Acute          +---------+---------------+---------+-----------+----------+--------------+ PTV      None                    No                   Acute          +---------+---------------+---------+-----------+----------+--------------+ PERO     None                    No                   Acute           +---------+---------------+---------+-----------+----------+--------------+ Gastroc  None                    No                   Acute          +---------+---------------+---------+-----------+----------+--------------+ GSV      Full                                                        +---------+---------------+---------+-----------+----------+--------------+ SSV      None                    No  Acute          +---------+---------------+---------+-----------+----------+--------------+     Summary: RIGHT: - No evidence of common femoral vein obstruction.  LEFT: - Findings consistent with acute deep vein thrombosis involving the left femoral vein, left popliteal vein, left posterior tibial veins, left peroneal veins, and left gastrocnemius veins. - Findings consistent with acute superficial vein thrombosis involving the left small saphenous vein.  *See table(s) above for measurements and observations.    Preliminary    CT Angio Abd/Pel w/ and/or w/o  Result Date: 01/15/2020 CLINICAL DATA:  Lower extremity DVT, evaluate for central extent EXAM: CTA ABDOMEN AND PELVIS WITHOUT AND WITH CONTRAST TECHNIQUE: Multidetector CT imaging of the abdomen and pelvis was performed using the standard protocol during bolus administration of intravenous contrast. Multiplanar reconstructed images and MIPs were obtained and reviewed to evaluate the vascular anatomy. CONTRAST:  125mL OMNIPAQUE IOHEXOL 350 MG/ML SOLN COMPARISON:  02/29/2016 FINDINGS: VASCULAR Aortic atherosclerosis. Standard branching pattern of the abdominal aorta. Normal opacification of the central veins without filling defects suspicious for thrombus in the IVC or iliac vessels. Review of the MIP images confirms the above findings. NON-VASCULAR Lower chest: No acute abnormality. Hepatobiliary: No focal liver abnormality is seen. No gallstones, gallbladder wall thickening, or biliary dilatation. Pancreas: Unremarkable. No  pancreatic ductal dilatation or surrounding inflammatory changes. Spleen: Normal in size without focal abnormality. Adrenals/Urinary Tract: Adrenal glands are unremarkable. Unchanged intermediate attenuation hemorrhagic or proteinaceous cysts of the superior pole of the left kidney and inferior pole of the right kidney. Kidneys are otherwise normal, without renal calculi, focal lesion, or hydronephrosis. Bladder is unremarkable. Stomach/Bowel: Stomach is within normal limits. Appendix appears normal. No evidence of bowel wall thickening, distention, or inflammatory changes. Lymphatic: No enlarged abdominal or pelvic lymph nodes. Reproductive: Prostate is unremarkable. Other: No abdominal wall hernia or abnormality. No abdominopelvic ascites. Musculoskeletal: No acute or significant osseous findings. IMPRESSION: 1.  No evidence of central venous thrombus. 2. Aortic atherosclerosis without aneurysm or acute findings on this non tailored examination. Aortic Atherosclerosis (ICD10-I70.0). Electronically Signed   By: Lauralyn PrimesAlex  Bibbey M.D.   On: 01/15/2020 13:43    Labs:  CBC: Recent Labs    01/15/20 0827  WBC 9.9  HGB 15.0  HCT 42.5  PLT 224    COAGS: No results for input(s): INR, APTT in the last 8760 hours.  BMP: Recent Labs    01/15/20 0827  NA 138  K 4.8  CL 101  CO2 24  GLUCOSE 162*  BUN 7  CALCIUM 9.2  CREATININE 0.78  GFRNONAA >60  GFRAA >60    LIVER FUNCTION TESTS: No results for input(s): BILITOT, AST, ALT, ALKPHOS, PROT, ALBUMIN in the last 8760 hours.  TUMOR MARKERS: No results for input(s): AFPTM, CEA, CA199, CHROMGRNA in the last 8760 hours.  Assessment and Plan:  + PE with minimal heart strain but Troponin wnl 02 99% RA; not visibly in distress + LLE DVT- swollen LLE-- less painful since admission to ED and Heparin drip I have reviewed imaging with Dr Miles CostainShick Rec: continue Heparin Echo cardiogram IR will keep on Radar-- no indication for any type of thrombolysis  procedure at this time.  Pt is aware  Will recheck in am  Thank you for this interesting consult.  I greatly enjoyed meeting Rick Anderson and look forward to participating in their care.  A copy of this report was sent to the requesting provider on this date.  Electronically Signed: Robet LeuPamela A Arya Luttrull, PA-C 01/15/2020, 2:49  PM   I spent a total of 40 Minutes    in face to face in clinical consultation, greater than 50% of which was counseling/coordinating care for PE and LLE DVT-- consideration for thrombolysis

## 2020-01-15 NOTE — Progress Notes (Signed)
ANTICOAGULATION CONSULT NOTE - Initial Consult  Pharmacy Consult for heparin Indication: DVT  Patient Measurements: Height: 6\' 2"  (188 cm) Weight: 275 lb (124.7 kg) IBW/kg (Calculated) : 82.2 Heparin Dosing Weight: 109.3kg  Vital Signs: Temp: 98.5 F (36.9 C) (03/31 0808) Temp Source: Oral (03/31 0808) BP: 130/87 (03/31 1100) Pulse Rate: 82 (03/31 1100)  Labs: Recent Labs    01/15/20 0827 01/15/20 1017  HGB 15.0  --   HCT 42.5  --   PLT 224  --   CREATININE 0.78  --   TROPONINIHS 16 11    Estimated Creatinine Clearance: 137.8 mL/min (by C-G formula based on SCr of 0.78 mg/dL).   Medical History: Past Medical History:  Diagnosis Date  . Diabetes mellitus without complication (HCC)   . High cholesterol   . Hypertension     Medications:  Infusions:  . heparin      Assessment: 58 yom presented to the ED with SOB and leg swelling. Found to have a DVT. CT chest is still pending for r/o PE. To start IV heparin. Baseline CBC is WNL. He is not on anticoagulation PTA.   Goal of Therapy:  Heparin level 0.3-0.7 units/ml Monitor platelets by anticoagulation protocol: Yes   Plan:  Heparin bolus 6000 units IV x 1 Heparin gtt 1800 units/hr Check a 6 hr heparin level Daily heparin level and CBC F/u CT chest and IR consultation  Rick Anderson, 01/17/20 01/15/2020,11:17 AM

## 2020-01-15 NOTE — Consult Note (Signed)
NAME:  Rick Anderson, MRN:  [id number], DOB:  Nov 27, 1960, LOS: 0 ADMISSION DATE:  01/15/2020, CONSULTATION DATE: 01/15/2020 REFERRING MD: Dr. Deirdre Priest, family medicine, CHIEF COMPLAINT: PE  Brief History   59 year old man admitted with left lower extremity DVT, multifocal pulmonary embolism with high clot burden.  Hemodynamically stable and on room air.  Treated with heparin infusion  History of present illness   59 year old man, never smoker, with history of hypertension, diabetes, hyperlipidemia, who reports that he was well until about 2 weeks prior to this admission when he noticed immediate pain and swelling.  Around the same time he also noticed some mild exertional shortness of breath with activities that were usually well-tolerated.  Both the leg pain and his dyspnea seem to worsen over about 10 days.  He believes that his shortness of breath may have been starting to improve since last week but his left lower extremity has been more painful.  This prompted him to come to the emergency department for evaluation.  He denies any chest pain. Lower extremity Doppler study showed extensive left lower extremity DVT multiple deep veins.  Subsequent CT-PA showed bilateral PE with high clot burden, RV: LV ratio 1.1.  He has remained hemodynamically stable, on room air.  Troponin negative.  He denies any long travel, periods of being sedentary.  Past Medical History   Past Medical History:  Diagnosis Date  . Diabetes mellitus without complication (HCC)   . High cholesterol   . Hypertension     Significant Hospital Events     Consults:  PCCM IR  Procedures:    Significant Diagnostic Tests:  CT-PA 3/31 >> bilateral segmental pulmonary emboli with a large proximal clot burden, mild cardiomegaly, mild enlargement of the RV with an RV: LV ratio 1.1.  CT angio abdomen/pelvis 3/31 >> no evidence of central venous thrombus  Bilateral lower extremity Doppler ultrasound 3/31 >> left  femoral, popliteal, posterior tibial, peroneal, gastrocnemius DVT.  No right-sided clot seen  Micro Data:  SARSCoV2 3/24 >> negative SARSCoV2 3/31 >>   Antimicrobials:    Interim history/subjective:  States that his left lower extremity is starting to feel little bit better  Objective   Blood pressure 136/84, pulse 82, temperature 98.5 F (36.9 C), temperature source Oral, resp. rate 19, height 6\' 2"  (1.88 m), weight 124.7 kg, SpO2 99 %.       No intake or output data in the 24 hours ending 01/15/20 1605 Filed Weights   01/15/20 0808  Weight: 124.7 kg    Examination: General: Pleasant obese man, laying on bed in no distress HENT: oro-pharynx clear, no oral lesions, pupils equal Lungs: Clear bilaterally, no wheezing, no crackles Cardiovascular: Regular, distant, no murmur or gallop Abdomen: Obese, nondistended, positive bowel sounds Extremities: Left lower extremity is larger than the right without significant pitting edema.  Tender to palpation especially behind the knee and at the calf.  Some dilated veins evident posteriorly Neuro: Awake, alert, follows commands, interacts normally, well-oriented, good strength  Resolved Hospital Problem list     Assessment & Plan:   Acute pulmonary embolism and left lower extremity DVT with high PA clot burden.  Appears to be an unprovoked clot.  He is clinically stable without any hemodynamic changes, tolerating room air without increased work of breathing.  Troponin is reassuring. PESI score 70 (Low risk) -Appreciate IR input.  Agree that at this juncture there is no clear indication for targeted lysis. -Obtain echocardiogram to assess for evidence of right  heart strain.  If present then we may revisit possible role for lytics -Agree with heparin infusion for now in the event that lytics do become indicated.  After echocardiogram and if no concerning clinical change then would change him to DOAC. -This would appear to be an unprovoked  clot, recommend duration of therapy for at least 6 months -Check the genetic component of hypercoagulable panel now, the remainder can be done in several months when he is off anticoagulation    Labs   CBC: Recent Labs  Lab 01/15/20 0827  WBC 9.9  HGB 15.0  HCT 42.5  MCV 82.4  PLT 224    Basic Metabolic Panel: Recent Labs  Lab 01/15/20 0827  NA 138  K 4.8  CL 101  CO2 24  GLUCOSE 162*  BUN 7  CREATININE 0.78  CALCIUM 9.2   GFR: Estimated Creatinine Clearance: 137.8 mL/min (by C-G formula based on SCr of 0.78 mg/dL). Recent Labs  Lab 01/15/20 0827  WBC 9.9    Liver Function Tests: No results for input(s): AST, ALT, ALKPHOS, BILITOT, PROT, ALBUMIN in the last 168 hours. No results for input(s): LIPASE, AMYLASE in the last 168 hours. No results for input(s): AMMONIA in the last 168 hours.  ABG    Component Value Date/Time   TCO2 24 03/26/2011 2115     Coagulation Profile: No results for input(s): INR, PROTIME in the last 168 hours.  Cardiac Enzymes: No results for input(s): CKTOTAL, CKMB, CKMBINDEX, TROPONINI in the last 168 hours.  HbA1C: No results found for: HGBA1C  CBG: No results for input(s): GLUCAP in the last 168 hours.  Review of Systems:   As per HPI  Past Medical History  He,  has a past medical history of Diabetes mellitus without complication (HCC), High cholesterol, and Hypertension.   Surgical History    Past Surgical History:  Procedure Laterality Date  . HAND TENDON SURGERY       Social History   reports that he has never smoked. He has never used smokeless tobacco. He reports that he does not drink alcohol or use drugs.   He was in [employer], worked as a Games developer.  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  No significant inhaled exposures reported.  Family History    Significant for thromboembolic disease in his mother.  He is unsure what kind of clot she  had.  No other family members  Allergies PCN   Home Medications  Prior to Admission medications   Medication Sig Start Date End Date Taking? Authorizing Provider  Ascorbic Acid (VITAMIN C) 1000 MG tablet Take 1,000 mg by mouth daily.   Yes [provider]  Echinacea-Goldenseal (ECHINACEA COMB/GOLDEN SEAL PO) Take 1 tablet by mouth daily.   Yes [provider]  empagliflozin (JARDIANCE) 25 MG TABS tablet Take 12.5 mg by mouth daily.   Yes [provider]  glipiZIDE (GLUCOTROL) 10 MG tablet Take 20 mg by mouth 2 (two) times daily before a meal.    Yes [provider]  ibuprofen (ADVIL) 600 MG tablet Take 600 mg by mouth 3 (three) times daily.   Yes [provider]  lisinopril (ZESTRIL) 20 MG tablet Take 10 mg by mouth daily.    Yes [provider]  metFORMIN (GLUCOPHAGE) 500 MG tablet Take 1,000 mg by mouth 2 (two) times daily.   Yes [provider]  Multiple Vitamins-Minerals (CENTRUM  SILVER ADULT 50+ PO) Take 1 tablet by mouth daily.   Yes [provider]  atorvastatin (LIPITOR) 40 MG tablet Take 20 mg by mouth daily.    [provider]  oxyCODONE-acetaminophen (PERCOCET/ROXICET) 5-325 MG tablet Take 1-2 tablets by mouth every 6 (six) hours as needed for moderate pain or severe pain. Patient not taking: Reported on 01/15/2020 02/29/16   Harvel Quale, MD     Critical care time: N/A     Baltazar Apo, MD, PhD 01/15/2020, 4:33 PM [facility] [phone] or if no answer [phone]

## 2020-01-15 NOTE — Progress Notes (Signed)
  Echocardiogram 2D Echocardiogram has been performed.  Rick Anderson 01/15/2020, 5:00 PM

## 2020-01-16 DIAGNOSIS — I2699 Other pulmonary embolism without acute cor pulmonale: Principal | ICD-10-CM

## 2020-01-16 DIAGNOSIS — I82412 Acute embolism and thrombosis of left femoral vein: Secondary | ICD-10-CM

## 2020-01-16 DIAGNOSIS — I2694 Multiple subsegmental pulmonary emboli without acute cor pulmonale: Secondary | ICD-10-CM

## 2020-01-16 LAB — GLUCOSE, CAPILLARY
Glucose-Capillary: 191 mg/dL — ABNORMAL HIGH (ref 70–99)
Glucose-Capillary: 233 mg/dL — ABNORMAL HIGH (ref 70–99)

## 2020-01-16 LAB — COMPREHENSIVE METABOLIC PANEL
ALT: 36 U/L (ref 0–44)
AST: 28 U/L (ref 15–41)
Albumin: 3.3 g/dL — ABNORMAL LOW (ref 3.5–5.0)
Alkaline Phosphatase: 62 U/L (ref 38–126)
Anion gap: 12 (ref 5–15)
BUN: 7 mg/dL (ref 6–20)
CO2: 24 mmol/L (ref 22–32)
Calcium: 9 mg/dL (ref 8.9–10.3)
Chloride: 103 mmol/L (ref 98–111)
Creatinine, Ser: 0.84 mg/dL (ref 0.61–1.24)
GFR calc Af Amer: >60 mL/min (ref >60)
GFR calc non Af Amer: >60 mL/min (ref >60)
Glucose, Bld: 220 mg/dL — ABNORMAL HIGH (ref 70–99)
Potassium: 4.5 mmol/L (ref 3.5–5.1)
Sodium: 139 mmol/L (ref 135–145)
Total Bilirubin: 0.7 mg/dL (ref 0.3–1.2)
Total Protein: 7 g/dL (ref 6.5–8.1)

## 2020-01-16 LAB — CBC
HCT: 39.1 % (ref 39.0–52.0)
Hemoglobin: 13.9 g/dL (ref 13.0–17.0)
MCH: 29 pg (ref 26.0–34.0)
MCHC: 35.5 g/dL (ref 30.0–36.0)
MCV: 81.5 fL (ref 80.0–100.0)
Platelets: 255 10*3/uL (ref 150–400)
RBC: 4.8 MIL/uL (ref 4.22–5.81)
RDW: 14.4 % (ref 11.5–15.5)
WBC: 8.8 10*3/uL (ref 4.0–10.5)
nRBC: 0 % (ref 0.0–0.2)

## 2020-01-16 LAB — HEPARIN LEVEL (UNFRACTIONATED): Heparin Unfractionated: 0.48 IU/mL (ref 0.30–0.70)

## 2020-01-16 MED ORDER — APIXABAN 5 MG PO TABS: 10.0000 mg | ORAL_TABLET | Freq: Two times a day (BID) | ORAL | 0 refills | Status: AC

## 2020-01-16 MED ORDER — APIXABAN 5 MG PO TABS
10.0000 mg | ORAL_TABLET | Freq: Two times a day (BID) | ORAL | Status: DC
  Administered 2020-01-16: 10 mg via ORAL

## 2020-01-16 MED ORDER — ATORVASTATIN CALCIUM 40 MG PO TABS: 40.0000 mg | ORAL_TABLET | Freq: Every day | ORAL | 0 refills | Status: AC

## 2020-01-16 MED ORDER — APIXABAN 5 MG PO TABS: 5.0000 mg | ORAL_TABLET | Freq: Two times a day (BID) | ORAL | Status: DC

## 2020-01-16 MED ORDER — APIXABAN 5 MG PO TABS: 5.0000 mg | ORAL_TABLET | Freq: Two times a day (BID) | ORAL | 0 refills | Status: AC

## 2020-01-16 MED ORDER — ATORVASTATIN CALCIUM 40 MG PO TABS
40.0000 mg | ORAL_TABLET | Freq: Every day | ORAL | Status: DC
  Administered 2020-01-16: 40 mg via ORAL
  Filled 2020-01-16: qty 1

## 2020-01-16 MED FILL — ELIQUIS STARTER PACK 5 MG T: 5 | 30 days supply | Qty: 74 | Fill #0

## 2020-01-16 NOTE — Progress Notes (Signed)
Late note entry  Went to evaluate patient around 2200 to establish baseline regarding respiratory function and check on patient status.  Patient reports he feels like his breathing is doing better but he still having left lower extremity pain.  He says that it is improving but is still there.  Heart rate was in the 80s, O2 sat was 95 or above.  Reported no other questions or concerns at this time.

## 2020-01-16 NOTE — Progress Notes (Addendum)
Discharge education and medication education given to patient and spouse with teach back. All questions and concerns answered. Peripheral IV and telemetry leads removed. All patient belongings given to patient. TOC medications were delivered to patient's room. Patient transported to main entrance by RN via wheelchair.

## 2020-01-16 NOTE — Progress Notes (Signed)
SCDs applied as per order, PRN tylenol given. RN will continue to monitor pain, edema, and pulses in affected extremity.

## 2020-01-16 NOTE — Progress Notes (Signed)
Occupational Therapy Evaluation  Clinical Impression: PTA pt lived with wife. Pt reports ambulating with a cane. Pt still drives. Pt requires occasional assistance with LB dressing, otherwise is independent. Pt reports 0 falls in the last 6 months. Pt currently independent to supervision for self-care and mobility tasks. Pt tolerated sitting EOB 10 min while engaging in therapy tasks, able to complete LB dressing with supervision. Pt ambulated around bed with cane and supervision, noting 0 instances of LOB. Pt demonstrates decreased strength, endurance, balance, standing tolerance, and activity tolerance impacting ability to complete self-care and functional transfer tasks. Recommend skilled OT services to address above deficits in order to promote function and prevent further decline.      01/16/20 1518  OT Visit Information  Last OT Received On 01/16/20  Assistance Needed +1  PT/OT/SLP Co-Evaluation/Treatment Yes  Reason for Co-Treatment Other (comment) (Decrease ease and strain on pt)  OT goals addressed during session ADL's and self-care;Strengthening/ROM  History of Present Illness Pt is a 59 yo male presenting with SOB and LLE swelling. Upon admission, found to have DVT in L femoral, popliteal, posterior tibial, peroneal, and gastrocnemius veins as well as multifocal pulmonary embolism with high clot burden. PMH includes: DM II, HTN, and HLD.  Precautions  Precautions Fall  Restrictions  Weight Bearing Restrictions No  Home Living  Family/patient expects to be discharged to: Private residence  Living Arrangements Spouse/significant other [facility])  Available Help at Discharge Family  Type of [place] to enter  Entrance Stairs-Number of Steps 3  [place] One level  Bathroom Shower/Tub Tub/shower unit  [employer]  (low toilet)  Home Equipment [place] - single point  Additional Comments 2 steps down into "Man cave."  Prior Function  Level of  Independence Independent with assistive device(s)  Comments Pt reports ambulating with a cane. Pt still drives. Pt requires occasional assistance with LB dressing, otherwise is independent. Pt reports 0 falls in the last 6 months.   Communication  Communication No difficulties  Pain Assessment  Pain Assessment Faces  Faces Pain Scale 2  Pain Location LLE calf  Pain Descriptors / Indicators Throbbing  Pain Intervention(s) Limited activity within patient's tolerance;Monitored during session;Repositioned  Cognition  Arousal/Alertness Awake/alert  Behavior During Therapy WFL for tasks assessed/performed  Overall Cognitive Status Within Functional Limits for tasks assessed  General Comments Pt pleasant and willing to participate in therapy. A&Ox 4.  Upper Extremity Assessment  Upper Extremity Assessment Generalized weakness  Lower Extremity Assessment  Lower Extremity Assessment Defer to PT evaluation  ADL  Overall ADL's  Needs assistance/impaired  Eating/Feeding Independent;Sitting  Grooming Set up;Supervision/safety;Sitting  Upper Body Bathing Set up;Supervision/ safety;Sitting  Lower Body Bathing Set up;Supervison/ safety;Sit to/from stand  Upper Body Dressing  Set up;Sitting  Lower Body Dressing Set up;Supervision/safety;Sit to/from Sales promotion account executive;Ambulation  Toileting- Clothing Manipulation and Hygiene Set up;Supervision/safety;Sit to/from stand  Functional mobility during ADLs Supervision/safety;Cane  General ADL Comments Pt able to sit EOB 10+ min while engaging in therapy tasks. Pt able to ambulate around bed to bedside chair with cane and supervision. Noted 0 instances of LOB.   Vision- History  Baseline Vision/History Wears glasses  Wears Glasses At all times  Bed Mobility  Overal bed mobility Modified Independent  General bed mobility comments HOB elevated, use of bedrail  Transfers  Overall transfer level Modified  independent  Equipment used Straight cane  Balance  Overall balance assessment Mild deficits observed, not formally tested  General Comments  General comments (skin integrity, edema, etc.) VSS on RA  OT - End of Session  Activity Tolerance Patient tolerated treatment well  Patient left in chair;with call bell/phone within reach;with family/visitor present  Nurse Communication Mobility status  OT Assessment  OT Recommendation/Assessment Patient needs continued OT Services  OT Visit Diagnosis Muscle weakness (generalized) (M62.81)  OT Problem List Decreased strength;Decreased activity tolerance;Impaired balance (sitting and/or standing)  OT Plan  OT Frequency (ACUTE ONLY) Min 2X/week  OT Treatment/Interventions (ACUTE ONLY) Self-care/ADL training;Therapeutic exercise;Neuromuscular education;Energy conservation;DME and/or AE instruction;Therapeutic activities;Patient/family education;Balance training  AM-PAC OT "6 Clicks" Daily Activity Outcome Measure (Version 2)  Help from another person eating meals? 4  Help from another person taking care of personal grooming? 3  Help from another person toileting, which includes using toliet, bedpan, or urinal? 3  Help from another person bathing (including washing, rinsing, drying)? 3  Help from another person to put on and taking off regular upper body clothing? 3  Help from another person to put on and taking off regular lower body clothing? 3  6 Click Score 19  OT Recommendation  Follow Up Recommendations No OT follow up  OT Equipment None recommended by OT  Acute Rehab OT Goals  Patient Stated Goal To go home  Time For Goal Achievement 01/30/20  Potential to Achieve Goals Good  OT Time Calculation  OT Start Time (ACUTE ONLY) 1401  OT Stop Time (ACUTE ONLY) 1433  OT Time Calculation (min) 32 min  OT General Charges  $OT Visit 1 Visit  OT Evaluation  $OT Eval Low Complexity 1 Low  Written Expression  Dominant Hand Left   Peterson Ao  OTR/L [phone]

## 2020-01-16 NOTE — Progress Notes (Signed)
Stopped by to see patient Remains hemodynamically stable  On room air  Denies any pain or discomfort Has not ambulated much  CT scan reviewed by myself Ultrasound result noted Echocardiogram with low normal right ventricular systolic function, mild elevation in pulmonary artery systolic pressure  Acute pulmonary embolism and left lower extremity DVT with high clot burden Unprovoked thromboembolic disease  Patient is hemodynamically stable No oxygen supplementation  6 months of anticoagulation recommended May be transitioned to a DOAC Hypercoagulable work-up completion at a later time  Stable from hemodynamic perspective  Virl Diamond, MD [place] [facility] Pager: [phone]

## 2020-01-16 NOTE — Discharge Instructions (Signed)
Thank you so much for allowing [place] to be part of your care. You were admitted to [facility] for worsening shortness of breath and leg swelling, found to have blood clots in your left lower leg veins (DVT/deep vein thrombosis) and blood clots in your lungs (pulmonary embolism).  It is unclear if there is a cause for your clots, we were not able to identify any at this time.  You were started on an IV blood thinner that was transitioned to an oral anticoagulant called Eliquis, you should continue this medication as prescribed for 6 months.  We encourage you to continue elevating your legs and wearing compression stockings when you can to help with the swelling and pain.  Tylenol 500 mg every 4-6 hours can help as well.  Please make sure you follow-up with your primary care provider and ensure you are up-to-date on all health maintenance including colon cancer screenings etc.   If you have worsening shortness of breath, swelling that is not improving or worsens, severe chest pain, bleeding, or feeling significant lightheadedness/dizziness please make sure you seek medical care soon as possible.

## 2020-01-16 NOTE — Progress Notes (Signed)
Inpatient Diabetes Program Recommendations  AACE/ADA: New Consensus Statement on Inpatient Glycemic Control (2015)  Target Ranges:  Prepandial:   less than 140 mg/dL      Peak postprandial:   less than 180 mg/dL (1-2 hours)      Critically ill patients:  140 - 180 mg/dL   Lab Results  Component Value Date   GLUCAP 233 (H) 01/16/2020   HGBA1C 8.2 (H) 01/15/2020    Review of Glycemic Control Results for LAMOND, GLANTZ (MRN [id number]) as of 01/16/2020 14:25  Ref. Range 01/15/2020 17:03 01/15/2020 20:40 01/16/2020 06:21 01/16/2020 11:42  Glucose-Capillary Latest Ref Range: 70 - 99 mg/dL 747 (H) 340 (H) 370 (H) 233 (H)   Diabetes history: Type 2 DM Outpatient Diabetes medications: Metformin 1000 mg BID, Glipizide 20 mg BID, Jardiance 12.5 mg QD Current orders for Inpatient glycemic control: none, CBG fsbg  Inpatient Diabetes Program Recommendations:    Noted SSI has been discontinued, however LT CBG was 233 mg/dL. Consider adding Novolog 0-15 units TID & HS back to regimen.   Thanks, Lujean Rave, MSN, RNC-OB Diabetes Coordinator [phone] (8a-5p)

## 2020-01-16 NOTE — Discharge Summary (Signed)
Family Medicine Teaching [facility] Discharge Summary  Patient name: Rick Anderson Medical record number: [id number] Date of birth: 1961/02/06 Age: 59 y.o. Gender: male Date of Admission: 01/15/2020  Date of Discharge: 01/16/2020 Admitting Physician: Carney Living, MD  Primary Care Provider: System, Pcp Not In Consultants: Critical Care, IR  Indication for Hospitalization: Shortness of breath  Discharge Diagnoses/Problem List:  Submassive Pulmonary Embolism  Left DVT DM Type II HTN HLD  Disposition: Home  Discharge Condition: Stable  Discharge Exam: 01/16/2020  General: Alert and oriented, no apparent distress      Cardiovascular: RRR with no murmurs noted Respiratory: CTA bilaterally     Gastrointestinal: Bowel sounds present. No abdominal pain MSK: 5/5 strength Extremities: left leg edema consistent with DVT seen on dopplers 03/31, warm and pulses present.  Brief Hospital Course:   Bader Stubblefield is a 59 yo gentleman with a history of T2DM, HTN, and hyperlipidemia who was admitted for the following:   Unprovoked, Subacute Submassive Pulmonary Embolism with Left DVT Presented to the ED with worsening dyspnea on exertion along with progressive left leg pain and swelling.  Chest x-ray clear. Lower extremity doppler study impressive for acute DVT involving left femoral, popliteal, posterior tibial, peroneal, and gastrocnemius veins.  Also showed acute superficial vein thrombosis involving small left saphenous vein.  CTA chest showing PE bilaterally in the distal left and right pulmonary arteries as well as the lobar and more distal branch vessels with findings concerning for right heart strain. However, echo showing preserved RV function (low normal). Heparin therapy was initiated.  IR and critical care were consulted, agreed with heparin therapy and no thrombolysis given he was hemodynamically stable without significant strain. He was transitioned from heparin drip to  Eliquis 10 mg BID x7 days (04/01-04-07) and start 5 mg twice daily x6 months. On the day of discharge his shortness of breath resolved and was ambulating without significant concerns.  He continues to have left lower extremity edema and pain that is consistent with findings of DVT.   Issues for Follow Up:  1. Appears this PE/DVT was unprovoked. Patient should be on anticoagulation for at least 6 months with further evaluation. Can consider hypercoagulable panel. 2. Please ensure he is UTD on age appropriate cancer screening, otherwise had no concerning symptoms to suggest underlying malignancy.  3. Continue Apixaban 10 mg BID x 7 days then 5 mg BID x 6 months  4. Please monitor medication compliance, reported not taking his atorvastatin.  5. A1c 8.2 on 3/31, please make sure he follows routinely for diabetic management.   Significant Procedures: None  Significant Labs and Imaging:  Recent Labs  Lab 01/15/20 0827 01/16/20 0442  WBC 9.9 8.8  HGB 15.0 13.9  HCT 42.5 39.1  PLT 224 255   Recent Labs  Lab 01/15/20 0827 01/16/20 0442  NA 138 139  K 4.8 4.5  CL 101 103  CO2 24 24  GLUCOSE 162* 220*  BUN 7 7  CREATININE 0.78 0.84  CALCIUM 9.2 9.0  ALKPHOS  --  62  AST  --  28  ALT  --  36  ALBUMIN  --  3.3*   DG Chest 2 View  Result Date: 01/15/2020 IMPRESSION: No active cardiopulmonary disease. Electronically Signed   By: Signa Kell M.D.   On: 01/15/2020 08:54   CT Angio Chest PE W/Cm &/Or Wo Cm   IMPRESSION: 1. Positive examination for pulmonary embolism. Large burden of embolus present bilaterally, in the distal left  and right pulmonary arteries as well as the lobar and more distal branch vessels. 2. Mild cardiomegaly with enlargement of the RV LV ratio to 1.1 and enlargement of the main pulmonary artery up to 3.4 cm, findings concerning for massive pulmonary embolism and right heart strain. 3. Mosaic attenuation of the airspaces, which may be due to variable hypoperfusion  in the setting of embolus or alternately small airways disease. 4.  Coronary artery disease. These results were called by telephone at the time of interpretation on 01/15/2020 at 1:31 pm to PA [facility] , who verbally acknowledged these results. Electronically Signed   By: Lauralyn Primes M.D.   On: 01/15/2020 13:31   ECHOCARDIOGRAM COMPLETE  Result Date: 01/15/2020 FINDINGS  Left Ventricle: Left ventricular ejection fraction, by estimation, is 60 to 65%. The left ventricle has normal function. The left ventricle has no regional wall motion abnormalities. The left ventricular internal cavity size was normal in size. There is  no left ventricular hypertrophy. Left ventricular diastolic parameters are indeterminate. Right Ventricle: The right ventricular size is moderately enlarged. No increase in right ventricular wall thickness. Right ventricular systolic function is low normal. There is mildly elevated pulmonary artery systolic pressure. The tricuspid regurgitant  velocity is 2.71 m/s, and with an assumed right atrial pressure of 8 mmHg, the estimated right ventricular systolic pressure is 37.4 mmHg. Left Atrium: Left atrial size was normal in size. Right Atrium: Right atrial size was normal in size. Pericardium: A small pericardial effusion is present. Mitral Valve: The mitral valve is normal in structure. Trivial mitral valve regurgitation. No evidence of mitral valve stenosis. Tricuspid Valve: The tricuspid valve is normal in structure. Tricuspid valve regurgitation is mild . No evidence of tricuspid stenosis. Aortic Valve: The aortic valve is tricuspid. Aortic valve regurgitation is not visualized. No aortic stenosis is present. Pulmonic Valve: The pulmonic valve was grossly normal. Pulmonic valve regurgitation is mild. No evidence of pulmonic stenosis. Aorta: Aortic dilatation noted. There is mild dilatation of the ascending aorta measuring 38 mm. Pulmonary Artery: The pulmonary artery is mildly  dilated. Venous: The inferior vena cava is normal in size with less than 50% respiratory variability, suggesting right atrial pressure of 8 mmHg.    VAS [place] LOWER EXTREMITY VENOUS (DVT) (ONLY [facility] & [facility] 7a-7p)  Result Date: 01/15/2020    Summary: RIGHT: - No evidence of common femoral vein obstruction.  LEFT: - Findings consistent with acute deep vein thrombosis involving the left femoral vein, left popliteal vein, left posterior tibial veins, left peroneal veins, and left gastrocnemius veins. - Findings consistent with acute superficial vein thrombosis involving the left small saphenous vein.  *See table(s) above for measurements and observations. Electronically signed by Gretta Began MD on 01/15/2020 at 6:48:00 PM.    Final    CT Angio Abd/Pel w/ and/or w/o  Result Date: 01/15/2020  IMPRESSION: 1.  No evidence of central venous thrombus. 2. Aortic atherosclerosis without aneurysm or acute findings on this non tailored examination. Aortic Atherosclerosis (ICD10-I70.0). Electronically Signed   By: Lauralyn Primes M.D.   On: 01/15/2020 13:43  Results/Tests Pending at Time of Discharge: None  Discharge Medications:    Medication List    STOP taking these medications   ibuprofen 600 MG tablet Commonly known as: ADVIL   oxyCODONE-acetaminophen 5-325 MG tablet Commonly known as: PERCOCET/ROXICET     TAKE these medications   apixaban 5 MG Tabs tablet Commonly known as: ELIQUIS Take 2 tablets (10 mg total) by mouth 2 (  two) times daily for 7 days.   apixaban 5 MG Tabs tablet Commonly known as: ELIQUIS Take 1 tablet (5 mg total) by mouth 2 (two) times daily. Start taking on: January 23, 2020   atorvastatin 40 MG tablet Commonly known as: LIPITOR Take 1 tablet (40 mg total) by mouth daily at 6 PM. What changed:   how much to take  when to take this   CENTRUM SILVER ADULT 50+ PO Take 1 tablet by mouth daily.   ECHINACEA COMB/GOLDEN SEAL PO Take 1 tablet by mouth daily.   empagliflozin 25 MG  Tabs tablet Commonly known as: JARDIANCE Take 12.5 mg by mouth daily.   glipiZIDE 10 MG tablet Commonly known as: GLUCOTROL Take 20 mg by mouth 2 (two) times daily before a meal.   lisinopril 20 MG tablet Commonly known as: ZESTRIL Take 10 mg by mouth daily.   metFORMIN 500 MG tablet Commonly known as: GLUCOPHAGE Take 1,000 mg by mouth 2 (two) times daily.   vitamin C 1000 MG tablet Take 1,000 mg by mouth daily.       Discharge Instructions: Please refer to Patient Instructions section of EMR for full details.  Patient was counseled important signs and symptoms that should prompt return to medical care, changes in medications, dietary instructions, activity restrictions, and follow up appointments.   Follow-Up Appointments: Follow-up Information    [facility]. Schedule an appointment as soon as possible for a visit.   Why: For hospital follow-up within the next 1-2 weeks          Carollee Leitz, MD 01/17/2020, 5:08 PM PGY-1, [place]

## 2020-01-16 NOTE — Progress Notes (Signed)
Family Medicine Teaching Service Daily Progress Note Intern Pager: [phone]  Patient name: Rick Anderson Medical record number: [id number] Date of birth: Dec 19, 1960 Age: 59 y.o. Gender: male  Primary Care Provider: System, Pcp Not In Consultants: IR, Pulmonology Code Status: DNI, agreeable to bloodtransfusions   Pt Overview and Major Events to Date:  03/31- Admitted   Assessment and Plan: Rick Anderson is a 59 y.o. male presenting with shortness of breath and leg swelling. PMH is significant for Type 2 DM, hypertension and hyperlipidemia.  Unprovoked,Subacute Submassive Pulmonary Embolism with Left DVT  Vital signs stable overnight.  Oxygen saturations 95% on room air.  Intermittent tachypnea.  CTA chest 03/31 shows large pulmonary embolism bilaterally in the distal left and right pulmonary arteries as well as the lobar and more distal branch vessels.  Mild cardiomegaly with enlargement of right ventricle/left ventricle ratio 1:1 and enlargement of main pulmonary artery up to 3.4 cm concerning for massive pulmonary embolism and right heart strain.  Lower extremity Dopplers findings consistent with acute DVT involving left femoral vein, left popliteal vein, left posterior tibial veins, left peroneal veins and left gastrocnemius veins.  Also findings of acute superficial vein thrombosis involving the left small saphenous vein.  PESI score 70.  Echo shows left ventricular ejection fraction 60-65% with normal left ventricular function and no regional wall abnormalities.  Right ventricular systolic function low normal and right ventricular size moderately enlarged and mildly elevated pulmonary artery systolic pressure.  Also concerning for mild dilation of ascending aorta measuring 38 mm and mild dilated pulmonary artery.  On exam lung sounds clear -IR and pulmonology following, appreciate recommendations -IR plans for no surgery -Continuous pulse ox and telemetry -Vital signs per unit -No need  for thrombolysis therapy if no evidence of right ventricular strain per IR and pulmonology. -Continue heparin drip, consider switch to DOAC today -Out of bed with assistance -PT/OT consult -Compression stockings and elevation of affected limb -Incentive spirometer every 2 hours while awake  Type 2 Diabetes CBG overnight 136.  HbA1c 8.2.  Required 1 unit insulin coverage yesterday.  Home medications include Jardiance 12.5 mg daily, glipizide 20 mg daily twice daily and Metformin 1000 mg twice daily -Continue carb modified diet -Consider restart home medications if no procedure planned -Discontinue ss covergage  Hypertension  SBP 92-110, DBP 55-66 overnight.  Required no as needed medications.  Home medications include lisinopril 10 mg daily -Hold lisinopril given softer blood pressures, resume when BP tolerates -Monitor blood pressure every 4  Hyperlipidemia No lipid panel on file.  Home medication atorvastatin 20 mg daily -Continue Atorvastatin 20 mg daily  FEN/GI: Carb modified diet Prophylaxis: Heparin infusion  Disposition: admit to inpatient  Subjective:  No acute events overnight. Denies any chest pain, shortness of breath.  He does report pain and swelling in left lower extremity.    Objective: Temp:  [97.3 F (36.3 C)-98.6 F (37 C)] 98.2 F (36.8 C) (04/01 0434) Pulse Rate:  [82-112] 92 (04/01 0436) Resp:  [16-27] 26 (04/01 0436) BP: (92-159)/(55-96) 110/66 (04/01 0434) SpO2:  [90 %-100 %] 94 % (04/01 0436) Weight:  [123.8 kg-124.7 kg] 123.8 kg (04/01 0214) Physical Exam:  General: Alert and oriented, no apparent distress  Cardiovascular: RRR with no murmurs noted Respiratory: CTA bilaterally  Gastrointestinal: Bowel sounds present. No abdominal pain MSK: 5/5 strength Extremities: left leg edema consistent with DVT seen on dopplers 03/31, warm and pulses present.  Laboratory: Recent Labs  Lab 01/15/20 0827 01/16/20 0442  WBC  9.9 8.8  HGB 15.0 13.9  HCT  42.5 39.1  PLT 224 255   Recent Labs  Lab 01/15/20 0827  NA 138  K 4.8  CL 101  CO2 24  BUN 7  CREATININE 0.78  CALCIUM 9.2  GLUCOSE 162*      Imaging/Diagnostic Tests: DG Chest 2 View  Result Date: 01/15/2020 CLINICAL DATA:  Short of breath with leg swelling. EXAM: CHEST - 2 VIEW COMPARISON:  03/08/2017 FINDINGS: The heart size and mediastinal contours are within normal limits. Both lungs are clear. Unchanged scarring in the left lower lobe. The visualized skeletal structures are unremarkable. IMPRESSION: No active cardiopulmonary disease. Electronically Signed   By: Signa Kell M.D.   On: 01/15/2020 08:54   CT Angio Chest PE W/Cm &/Or Wo Cm  Result Date: 01/15/2020 CLINICAL DATA:  DVT, evaluate for PE EXAM: CT ANGIOGRAPHY CHEST WITH CONTRAST TECHNIQUE: Multidetector CT imaging of the chest was performed using the standard protocol during bolus administration of intravenous contrast. Multiplanar CT image reconstructions and MIPs were obtained to evaluate the vascular anatomy. CONTRAST:  OMNIPAQUE IOHEXOL 350 MG/ML SOLN COMPARISON:  None. FINDINGS: Cardiovascular: Satisfactory opacification of the pulmonary arteries to the segmental level. Positive examination for pulmonary embolism. Large burden of embolus present bilaterally, in the distal left and right pulmonary arteries as well as the lobar and more distal branch vessels. Mild cardiomegaly. Enlargement of the RV LV ratio to 1.1. Enlargement of the main pulmonary artery up to 3.4 cm. Three-vessel coronary artery calcifications. Trace pericardial effusion. Mediastinum/Nodes: No enlarged mediastinal, hilar, or axillary lymph nodes. Thyroid gland, trachea, and esophagus demonstrate no significant findings. Lungs/Pleura: Mosaic attenuation of the airspaces, which may be due to variable hypoperfusion in the setting of embolus or alternately small airways disease. No pleural effusion or pneumothorax. Upper Abdomen: No acute  abnormality. Musculoskeletal: No chest wall abnormality. No acute or significant osseous findings. Review of the MIP images confirms the above findings. IMPRESSION: 1. Positive examination for pulmonary embolism. Large burden of embolus present bilaterally, in the distal left and right pulmonary arteries as well as the lobar and more distal branch vessels. 2. Mild cardiomegaly with enlargement of the RV LV ratio to 1.1 and enlargement of the main pulmonary artery up to 3.4 cm, findings concerning for massive pulmonary embolism and right heart strain. 3. Mosaic attenuation of the airspaces, which may be due to variable hypoperfusion in the setting of embolus or alternately small airways disease. 4.  Coronary artery disease. These results were called by telephone at the time of interpretation on 01/15/2020 at 1:31 pm to [facility] , who verbally acknowledged these results. Electronically Signed   By: Lauralyn Primes M.D.   On: 01/15/2020 13:31   ECHOCARDIOGRAM COMPLETE  Result Date: 01/15/2020    ECHOCARDIOGRAM REPORT   Patient Name:   JAYVIN HURRELL Date of Exam: 01/15/2020 Medical Rec #:  [id number]       Height:       74.0 in Accession #:    [id number]      Weight:       275.0 lb Date of Birth:  10-Feb-1961        BSA:          2.488 m Patient Age:    58 years        BP:           136/84 mmHg Patient Gender: M  HR:           77 bpm. Exam Location:  Inpatient Procedure: 2D Echo Indications:    Pulmonary Embolus  History:        Patient has no prior history of Echocardiogram examinations.                 Risk Factors:Hypertension and Diabetes.  Sonographer:    Thurman Coyer RDCS (AE) Referring Phys: [id number] Jeanella Craze IMPRESSIONS  1. Left ventricular ejection fraction, by estimation, is 60 to 65%. The left ventricle has normal function. The left ventricle has no regional wall motion abnormalities. Left ventricular diastolic parameters are indeterminate.  2. Right ventricular systolic  function is low normal. The right ventricular size is moderately enlarged. There is mildly elevated pulmonary artery systolic pressure.  3. The mitral valve is normal in structure. Trivial mitral valve regurgitation. No evidence of mitral stenosis.  4. The aortic valve is tricuspid. Aortic valve regurgitation is not visualized. No aortic stenosis is present.  5. Aortic dilatation noted. There is mild dilatation of the ascending aorta measuring 38 mm.  6. Mildly dilated pulmonary artery.  7. The inferior vena cava is normal in size with <50% respiratory variability, suggesting right atrial pressure of 8 mmHg. Comparison(s): No prior Echocardiogram. FINDINGS  Left Ventricle: Left ventricular ejection fraction, by estimation, is 60 to 65%. The left ventricle has normal function. The left ventricle has no regional wall motion abnormalities. The left ventricular internal cavity size was normal in size. There is  no left ventricular hypertrophy. Left ventricular diastolic parameters are indeterminate. Right Ventricle: The right ventricular size is moderately enlarged. No increase in right ventricular wall thickness. Right ventricular systolic function is low normal. There is mildly elevated pulmonary artery systolic pressure. The tricuspid regurgitant  velocity is 2.71 m/s, and with an assumed right atrial pressure of 8 mmHg, the estimated right ventricular systolic pressure is 37.4 mmHg. Left Atrium: Left atrial size was normal in size. Right Atrium: Right atrial size was normal in size. Pericardium: A small pericardial effusion is present. Mitral Valve: The mitral valve is normal in structure. Trivial mitral valve regurgitation. No evidence of mitral valve stenosis. Tricuspid Valve: The tricuspid valve is normal in structure. Tricuspid valve regurgitation is mild . No evidence of tricuspid stenosis. Aortic Valve: The aortic valve is tricuspid. Aortic valve regurgitation is not visualized. No aortic stenosis is present.  Pulmonic Valve: The pulmonic valve was grossly normal. Pulmonic valve regurgitation is mild. No evidence of pulmonic stenosis. Aorta: Aortic dilatation noted. There is mild dilatation of the ascending aorta measuring 38 mm. Pulmonary Artery: The pulmonary artery is mildly dilated. Venous: The inferior vena cava is normal in size with less than 50% respiratory variability, suggesting right atrial pressure of 8 mmHg. IAS/Shunts: No atrial level shunt detected by color flow Doppler.  LEFT VENTRICLE PLAX 2D LVIDd:         4.92 cm  Diastology LVIDs:         3.28 cm  LV e' lateral:   7.72 cm/s LV PW:         0.93 cm  LV E/e' lateral: 9.9 LV IVS:        0.96 cm  LV e' medial:    6.74 cm/s LVOT diam:     2.60 cm  LV E/e' medial:  11.3 LV SV:         81 LV SV Index:   32 LVOT Area:     5.31 cm  RIGHT VENTRICLE RV S prime:     10.00 cm/s TAPSE (M-mode): 1.7 cm LEFT ATRIUM             Index       RIGHT ATRIUM           Index LA diam:        3.20 cm 1.29 cm/m  RA Area:     15.70 cm LA Vol (A2C):   54.9 ml 22.06 ml/m RA Volume:   33.20 ml  13.34 ml/m LA Vol (A4C):   22.5 ml 9.04 ml/m LA Biplane Vol: 36.3 ml 14.59 ml/m  AORTIC VALVE LVOT Vmax:   81.60 cm/s LVOT Vmean:  53.200 cm/s LVOT VTI:    0.152 m  AORTA Ao Root diam: 3.10 cm MITRAL VALVE               TRICUSPID VALVE MV Area (PHT): 3.06 cm    TR Peak grad:   29.4 mmHg MV Decel Time: 248 msec    TR Vmax:        271.00 cm/s MV E velocity: 76.30 cm/s MV A velocity: 67.30 cm/s  SHUNTS MV E/A ratio:  1.13        Systemic VTI:  0.15 m                            Systemic Diam: 2.60 cm Jodelle Red MD Electronically signed by Jodelle Red MD Signature Date/Time: 01/15/2020/9:55:56 PM    Final    VAS [place] LOWER EXTREMITY VENOUS (DVT) (ONLY [facility] & [facility] 7a-7p)  Result Date: 01/15/2020  Lower Venous DVTStudy Indications: Swelling, and Palpable Cord.  Comparison Study: No prior study Performing Technologist: Gertie Fey MHA, RDMS, RVT, RDCS  Examination  Guidelines: A complete evaluation includes B-mode imaging, spectral Doppler, color Doppler, and power Doppler as needed of all accessible portions of each vessel. Bilateral testing is considered an integral part of a complete examination. Limited examinations for reoccurring indications may be performed as noted. The reflux portion of the exam is performed with the patient in reverse Trendelenburg.  +-----+---------------+---------+-----------+----------+--------------+ RIGHTCompressibilityPhasicitySpontaneityPropertiesThrombus Aging +-----+---------------+---------+-----------+----------+--------------+ CFV  Full           Yes      Yes                                 +-----+---------------+---------+-----------+----------+--------------+   +---------+---------------+---------+-----------+----------+--------------+ LEFT     CompressibilityPhasicitySpontaneityPropertiesThrombus Aging +---------+---------------+---------+-----------+----------+--------------+ CFV      Full           Yes      Yes                                 +---------+---------------+---------+-----------+----------+--------------+ SFJ      Full                                                        +---------+---------------+---------+-----------+----------+--------------+ FV Prox  None                    No                   Acute          +---------+---------------+---------+-----------+----------+--------------+  FV Mid   None                                         Acute          +---------+---------------+---------+-----------+----------+--------------+ FV DistalNone                                         Acute          +---------+---------------+---------+-----------+----------+--------------+ PFV      Full                                                        +---------+---------------+---------+-----------+----------+--------------+ POP      None                    No                    Acute          +---------+---------------+---------+-----------+----------+--------------+ PTV      None                    No                   Acute          +---------+---------------+---------+-----------+----------+--------------+ PERO     None                    No                   Acute          +---------+---------------+---------+-----------+----------+--------------+ Gastroc  None                    No                   Acute          +---------+---------------+---------+-----------+----------+--------------+ GSV      Full                                                        +---------+---------------+---------+-----------+----------+--------------+ SSV      None                    No                   Acute          +---------+---------------+---------+-----------+----------+--------------+     Summary: RIGHT: - No evidence of common femoral vein obstruction.  LEFT: - Findings consistent with acute deep vein thrombosis involving the left femoral vein, left popliteal vein, left posterior tibial veins, left peroneal veins, and left gastrocnemius veins. - Findings consistent with acute superficial vein thrombosis involving the left small saphenous vein.  *See table(s) above for measurements and observations. Electronically signed by Gretta Beganodd Early MD on 01/15/2020 at 6:48:00 PM.    Final    CT Angio Abd/Pel w/ and/or w/o  Result Date: 01/15/2020 CLINICAL DATA:  Lower extremity DVT, evaluate for central extent EXAM: CTA ABDOMEN AND PELVIS WITHOUT AND WITH CONTRAST TECHNIQUE: Multidetector CT imaging of the abdomen and pelvis was performed using the standard protocol during bolus administration of intravenous contrast. Multiplanar reconstructed images and MIPs were obtained and reviewed to evaluate the vascular anatomy. CONTRAST:  OMNIPAQUE IOHEXOL 350 MG/ML SOLN COMPARISON:  02/29/2016 FINDINGS: VASCULAR Aortic atherosclerosis. Standard branching  pattern of the abdominal aorta. Normal opacification of the central veins without filling defects suspicious for thrombus in the IVC or iliac vessels. Review of the MIP images confirms the above findings. NON-VASCULAR Lower chest: No acute abnormality. Hepatobiliary: No focal liver abnormality is seen. No gallstones, gallbladder wall thickening, or biliary dilatation. Pancreas: Unremarkable. No pancreatic ductal dilatation or surrounding inflammatory changes. Spleen: Normal in size without focal abnormality. Adrenals/Urinary Tract: Adrenal glands are unremarkable. Unchanged intermediate attenuation hemorrhagic or proteinaceous cysts of the superior pole of the left kidney and inferior pole of the right kidney. Kidneys are otherwise normal, without renal calculi, focal lesion, or hydronephrosis. Bladder is unremarkable. Stomach/Bowel: Stomach is within normal limits. Appendix appears normal. No evidence of bowel wall thickening, distention, or inflammatory changes. Lymphatic: No enlarged abdominal or pelvic lymph nodes. Reproductive: Prostate is unremarkable. Other: No abdominal wall hernia or abnormality. No abdominopelvic ascites. Musculoskeletal: No acute or significant osseous findings. IMPRESSION: 1.  No evidence of central venous thrombus. 2. Aortic atherosclerosis without aneurysm or acute findings on this non tailored examination. Aortic Atherosclerosis (ICD10-I70.0). Electronically Signed   By: Lauralyn Primes M.D.   On: 01/15/2020 13:43    Dana Allan, MD 01/16/2020, 5:45 AM PGY-1, [place] [facility] FPTS Intern pager: [phone], text pages welcome

## 2020-01-16 NOTE — Evaluation (Signed)
Physical Therapy Evaluation Patient Details Name: Rick Anderson MRN: [id number] DOB: Mar 14, 1961 Today's Date: 01/16/2020   History of Present Illness  Pt is a 59 yo male presenting with SOB and LLE swelling. Upon admission, found to have DVT in L femoral, popliteal, posterior tibial, peroneal, and gastrocnemius veins as well as multifocal pulmonary embolism with high clot burden. PMH includes: DM II, HTN, and HLD.  Clinical Impression  Pt in bed upon arrival of PT, agreeable to evaluation at this time. Prior to admission the pt was completely independent with mobility and ADLs with use of a SPC, the pt was able to drive and navigate stairs without concern. The pt now presents with minor limitations in functional mobility, and endurance due to above dx, and will continue to benefit from skilled PT to address these deficits, but is safe to return home with assist from wife and continued use of SPC. The pt will continue to benefit from skilled PT to progress functional endurance and participate in stair training prior to d/c home.      Follow Up Recommendations No PT follow up;Supervision/Assistance - 24 hour    Equipment Recommendations  None recommended by PT    Recommendations for Other Services       Precautions / Restrictions Precautions Precautions: Fall Restrictions Weight Bearing Restrictions: No      Mobility  Bed Mobility Overal bed mobility: Modified Independent                Transfers Overall transfer level: Modified independent Equipment used: Straight cane             General transfer comment: pt able to stand from various surfaces without assist for power up or stability  Ambulation/Gait Ambulation/Gait assistance: Supervision Gait Distance (Feet): 15 Feet Assistive device: Straight cane Gait Pattern/deviations: Step-through pattern Gait velocity: decreased Gait velocity interpretation: <1.31 ft/sec, indicative of household ambulator General Gait  Details: pt reports gait is at baseline, limited only by slight pain in LLE/calf  Stairs            Wheelchair Mobility    Modified Rankin (Stroke Patients Only)       Balance Overall balance assessment: Mild deficits observed, not formally tested                                           Pertinent Vitals/Pain Pain Assessment: Faces Faces Pain Scale: Hurts a little bit Pain Location: LLE (calf) especially with touch Pain Descriptors / Indicators: Throbbing Pain Intervention(s): Limited activity within patient's tolerance;Monitored during session;Repositioned    Home Living Family/patient expects to be discharged to:: Private residence Living Arrangements: Spouse/significant other Available Help at Discharge: Family Type of Home: House Home Access: Stairs to enter   Technical brewer of Steps: 3 Home Layout: One level Home Equipment: Cane - single point Additional Comments: 2 steps down into "Man cave."    Prior Function Level of Independence: Independent with assistive device(s)         Comments: Pt reports ambulating with a cane. Pt requires occasional assistance with LB dressing, otherwise is independent. Pt reports 0 falls in the last 6 months.      Hand Dominance   Dominant Hand: Left    Extremity/Trunk Assessment   Upper Extremity Assessment Upper Extremity Assessment: Defer to OT evaluation    Lower Extremity Assessment Lower Extremity Assessment: Overall WFL for tasks  assessed;LLE deficits/detail LLE Deficits / Details: sig swelling to L calf, pt able to use functionally, could not tolerate MMT due to pain LLE: Unable to fully assess due to pain    Cervical / Trunk Assessment Cervical / Trunk Assessment: Normal  Communication   Communication: No difficulties  Cognition Arousal/Alertness: Awake/alert Behavior During Therapy: WFL for tasks assessed/performed Overall Cognitive Status: Within Functional Limits for tasks  assessed                                        General Comments General comments (skin integrity, edema, etc.): VSS    Exercises     Assessment/Plan    PT Assessment Patient needs continued PT services  PT Problem List Decreased mobility;Decreased activity tolerance;Decreased coordination;Decreased strength;Pain       PT Treatment Interventions Therapeutic exercise;Gait training;Balance training;Stair training;Functional mobility training;Therapeutic activities;Patient/family education    PT Goals (Current goals can be found in the Care Plan section)  Acute Rehab PT Goals Patient Stated Goal: return home asap PT Goal Formulation: With patient/family Time For Goal Achievement: 01/30/20 Potential to Achieve Goals: Good    Frequency Min 3X/week   Barriers to discharge        Co-evaluation               AM-PAC PT "6 Clicks" Mobility  Outcome Measure Help needed turning from your back to your side while in a flat bed without using bedrails?: None Help needed moving from lying on your back to sitting on the side of a flat bed without using bedrails?: None Help needed moving to and from a bed to a chair (including a wheelchair)?: A Little Help needed standing up from a chair using your arms (e.g., wheelchair or bedside chair)?: None Help needed to walk in hospital room?: A Little Help needed climbing 3-5 steps with a railing? : A Little 6 Click Score: 21    End of Session   Activity Tolerance: Patient tolerated treatment well Patient left: in chair;with chair alarm set;with call bell/phone within reach;with SCD's reapplied;with family/visitor present Nurse Communication: Mobility status(questions about being able to drive) PT Visit Diagnosis: Difficulty in walking, not elsewhere classified (R26.2);Unsteadiness on feet (R26.81);Pain Pain - Right/Left: Left Pain - part of body: Leg    Time: 9371-6967 PT Time Calculation (min) (ACUTE ONLY): 27  min   Charges:   PT Evaluation $PT Eval Moderate Complexity: 1 Mod          Rolm Baptise, PT, DPT   Acute Rehabilitation Department Pager #: [phone]  Gaetana Michaelis 01/16/2020, 3:27 PM

## 2020-01-16 NOTE — Progress Notes (Addendum)
During hourly rounding, patient complained to RN of more intense pain in left lower extremity. Upon assessment, left lower extremity is swollen more than previous assessment (+2 edema) with +2 dorsalis pedis pulse, no erythema or palor noted. Family medicine teaching services paged. MD states an MD will come to patient bedside.  RN received phone call from MD. Verbal orders recieved to elevate patients left lower extremity, give next dose of PRN tylenol, and if next dose of PRN tylenol does not improve pain, page MD again. Rn will carry out orders.

## 2020-01-16 NOTE — Progress Notes (Addendum)
Patient rating pain in left lower extremity at 7 out of 10, states it is "aching and throbbing and tylenol is not helping". +1 lower extremity edema observed in left lower extremity with +2 palpable dorsalis pedis pulse. RN repositioned patient and educated on guided imagery. Patient requesting medication stronger than tylenol. Family medicine teaching services paged regarding pain medication. MD states she will look into other pain relieving options.

## 2020-01-16 NOTE — TOC Transition Note (Addendum)
Transition of Care [facility]) - CM/SW Discharge Note   Patient Details  Name: Rick Anderson MRN: [id number] Date of Birth: 18-Aug-1961  Transition of Care [facility]) CM/SW Contact:  Leone Haven, RN Phone Number: 01/16/2020, 2:27 PM   Clinical Narrative:    Patient is for dc today, MD [facility] to [facility] for 30 day free.  Patient goes to [place] in [place].  PCP is Dr. Tereasa Coop  Fax is [phone].   Final next level of care: Home/Self Care Barriers to Discharge: No Barriers Identified   Patient Goals and CMS Choice        Discharge Placement                       Discharge Plan and Services                                     Social Determinants of Health (SDOH) Interventions     Readmission Risk Interventions No flowsheet data found.

## 2020-01-16 NOTE — Progress Notes (Addendum)
ANTICOAGULATION CONSULT NOTE - Follow Up Consult  Pharmacy Consult for heparin Indication: DVT + PE   Patient Measurements: Height: 6\' 2"  (188 cm) Weight: 272 lb 14.4 oz (123.8 kg) IBW/kg (Calculated) : 82.2 Heparin Dosing Weight: 109.3kg  Vital Signs: Temp: 98.2 F (36.8 C) (04/01 0434) Temp Source: Oral (04/01 0434) BP: 110/66 (04/01 0434) Pulse Rate: 92 (04/01 0436)  Labs: Recent Labs    01/15/20 0827 01/15/20 1017 01/15/20 1930 01/16/20 0442  HGB 15.0  --   --  13.9  HCT 42.5  --   --  39.1  PLT 224  --   --  255  HEPARINUNFRC  --   --  0.59  --   CREATININE 0.78  --   --   --   TROPONINIHS 16 11  --   --     Estimated Creatinine Clearance: 137.2 mL/min (by C-G formula based on SCr of 0.78 mg/dL).   Medical History: Past Medical History:  Diagnosis Date  . Diabetes mellitus without complication (HCC)   . High cholesterol   . Hypertension     Medications:  Infusions:  . heparin 1,800 Units/hr (01/15/20 1323)    Assessment: 58 yom presented to the ED with SOB and leg swelling. Found to have extensive left leg DVT and CT chest positive for bilateral PE with mild heart strain. Started on IV heparin. He is not on anticoagulation PTA.  Heparin level this morning is therapeutic at 0.48 on 1800 units/hour. H&H is decreased from yesterday at 13.9/39.1, platelets remain wnl at 255. No overt bleeding.  Goal of Therapy:  Heparin level 0.3-0.7 units/ml Monitor platelets by anticoagulation protocol: Yes   Plan:  Continue heparin infusion at 1800 units/hr  Daily heparin level and CBC Monitor for bleeding Follow up plans for oral anticoagulation  Follow up IR plans   **Addendum** New pharmacy consult to discontinue heparin and initiate Eliquis for VTE treatment. Patient's renal function is stable, today's Creatinine remains < 1 with an eCrCl ~130 ml/min. Will opt for full 7 days of 10 mg twice daily oral anticoagulation due to patients clot risk and time of  heparin infusion only lasting two days.   New plan:  Discontinue heparin infusion  Start Eliquis 10 mg by mouth twice daily for 7 days (through 01/22/20) then start Eliquis 5 mg by mouth twice daily. First dose of 5 mg will be in the morning of 01/23/2020.  Educate patient Plan to provide first 30 days free through [facility] pharmacy - patient to follow up at [facility] where he fills his medications for following prescriptions.    Thank you,   [facility], PharmD PGY-1 Pharmacy Resident   Please check amion for clinical pharmacist contact number

## 2020-07-29 IMAGING — CT CT ANGIO CHEST
2 of 7 series · 17 of 46 positions shown · IV contrast (APPLIED)
Comparison: None.

CLINICAL DATA: DVT, evaluate for PE

EXAM:
CT ANGIOGRAPHY CHEST WITH CONTRAST
TECHNIQUE: Multidetector CT imaging of the chest was performed using the
standard protocol during bolus administration of intravenous
contrast. Multiplanar CT image reconstructions and MIPs were
obtained to evaluate the vascular anatomy.
CONTRAST:  125mL OMNIPAQUE IOHEXOL 350 MG/ML SOLN

[Series 5: thins · axial · 0.71mm/px · z∈[+1204,+1484]mm · 14 of 451 slices shown]
[im 26/451  lung]
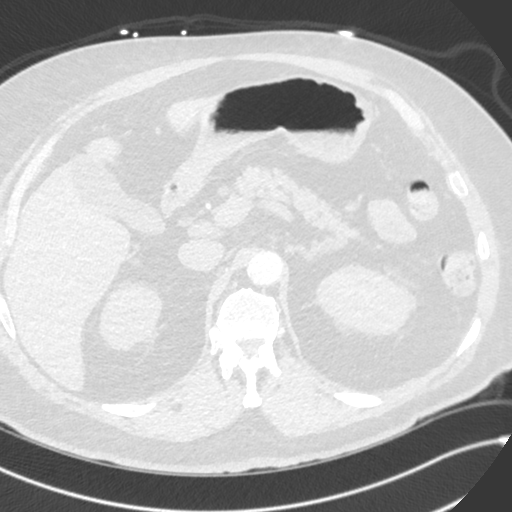
[im 51/451  soft-tissue]
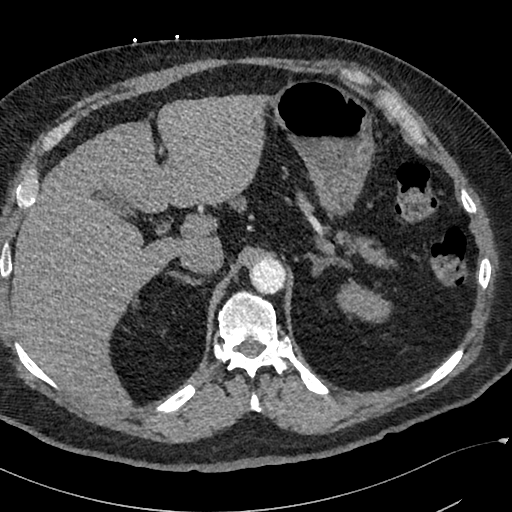
[im 101/451  lung]
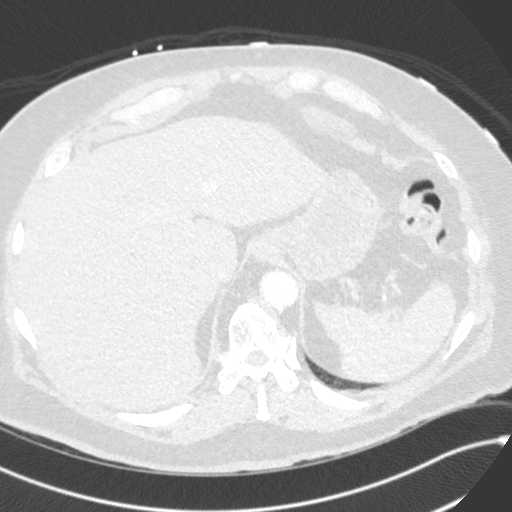
[im 126/451  soft-tissue]
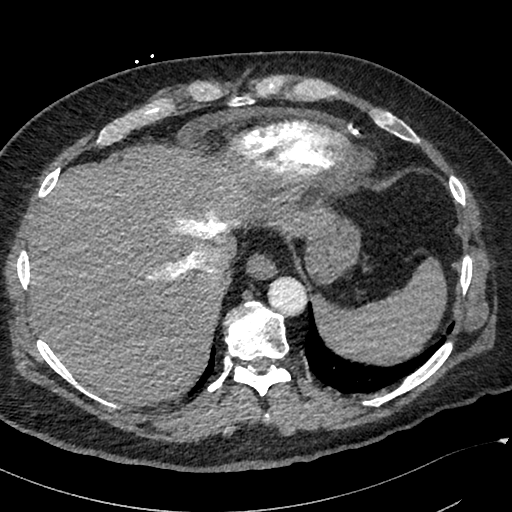
[im 151/451  lung]
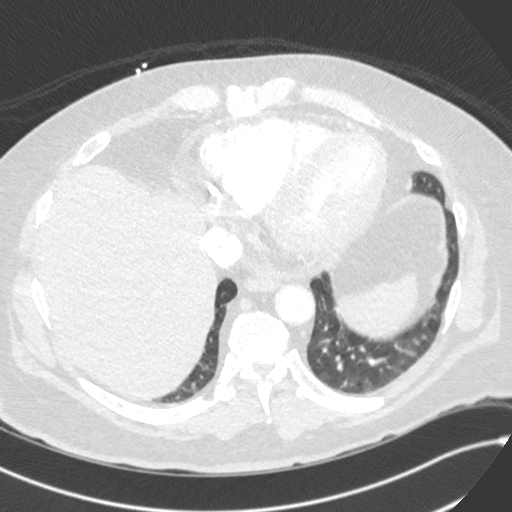
[im 176/451  soft-tissue]
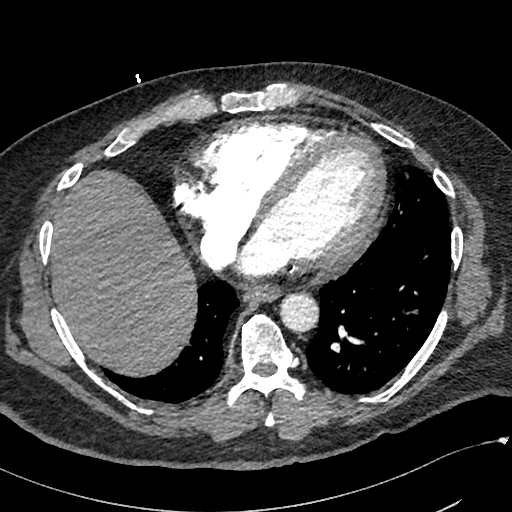
[im 201/451  lung]
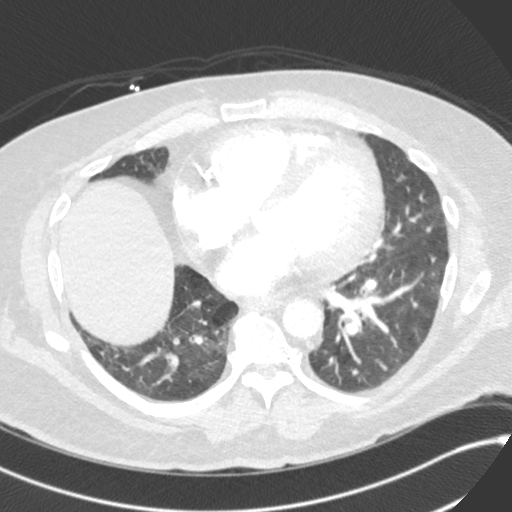
[im 251/451  soft-tissue]
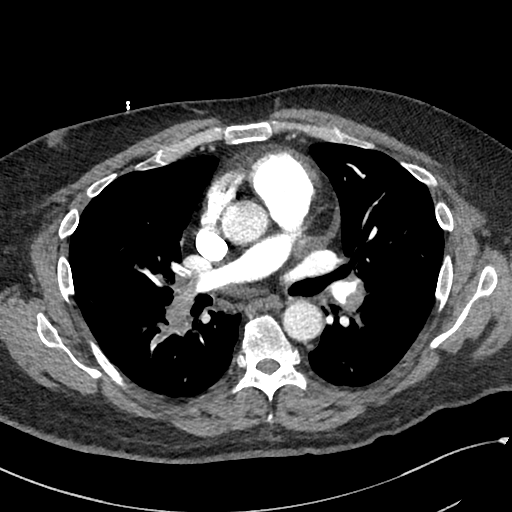
[im 276/451  lung]
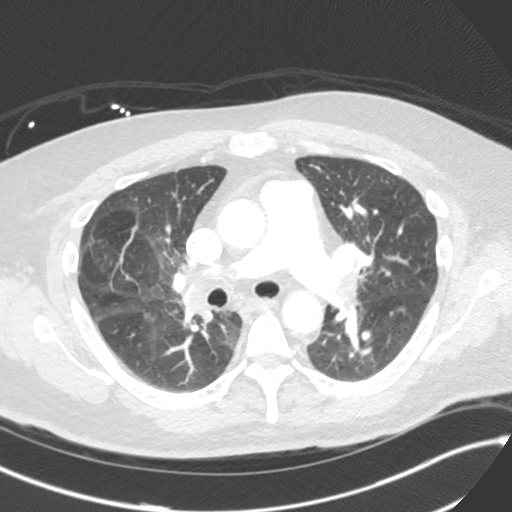
[im 301/451  soft-tissue]
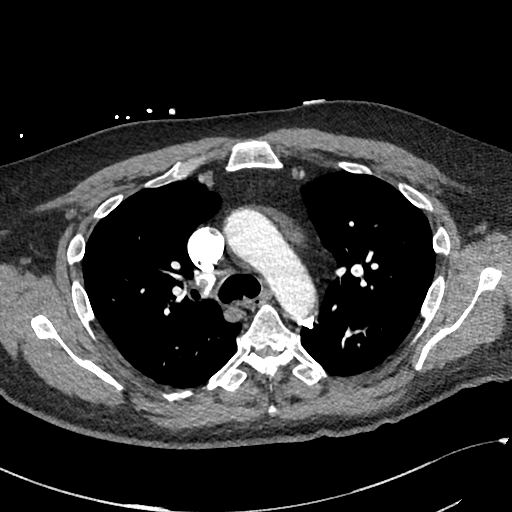
[im 326/451  lung]
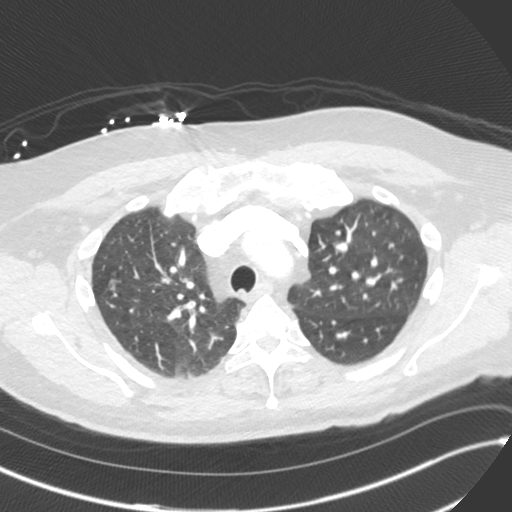
[im 351/451  soft-tissue]
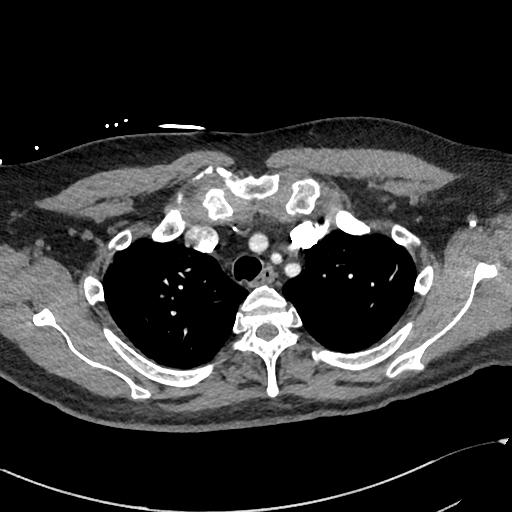
[im 401/451  lung]
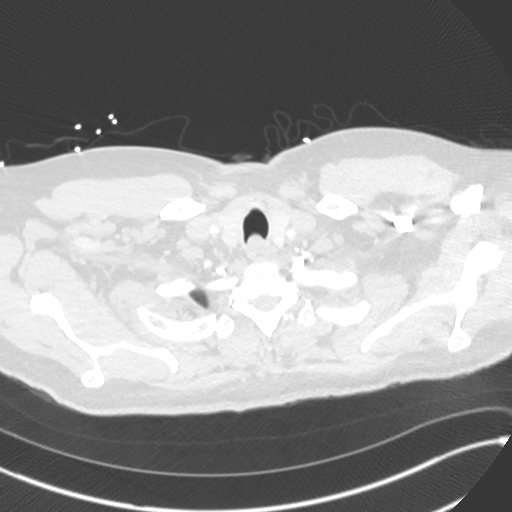
[im 426/451  soft-tissue]
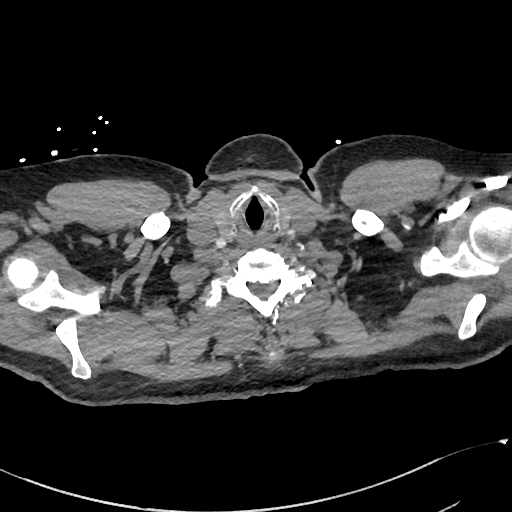

[Series 6: cor · coronal · 0.66mm/px · 3 of 150 slices shown]
[im 38/150  soft-tissue]
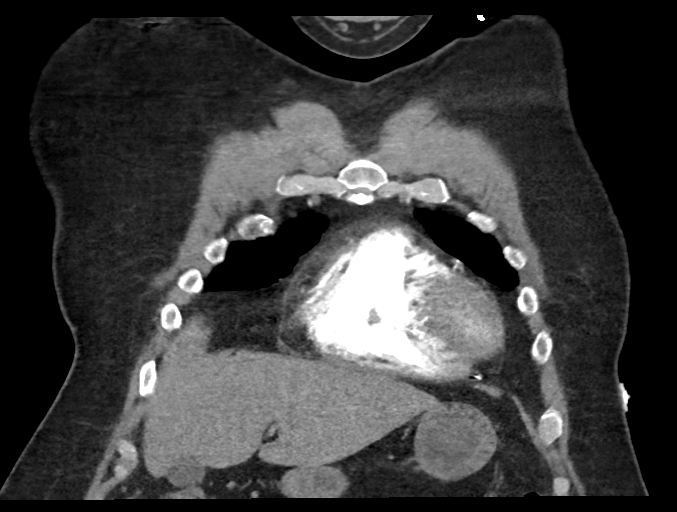
[im 75/150  soft-tissue]
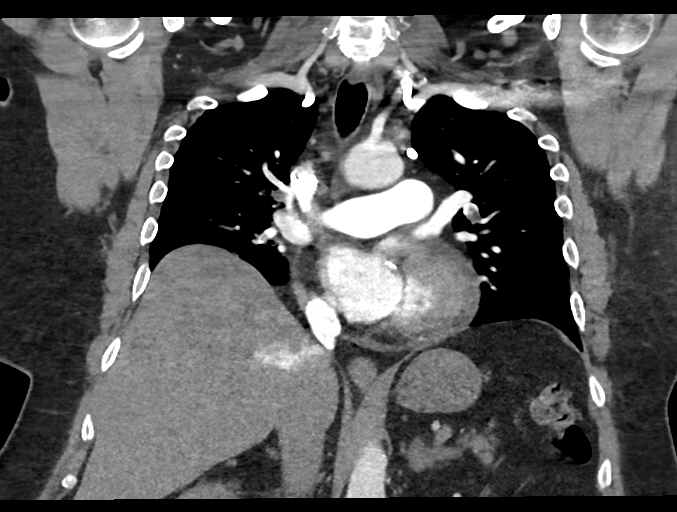
[im 112/150  soft-tissue]
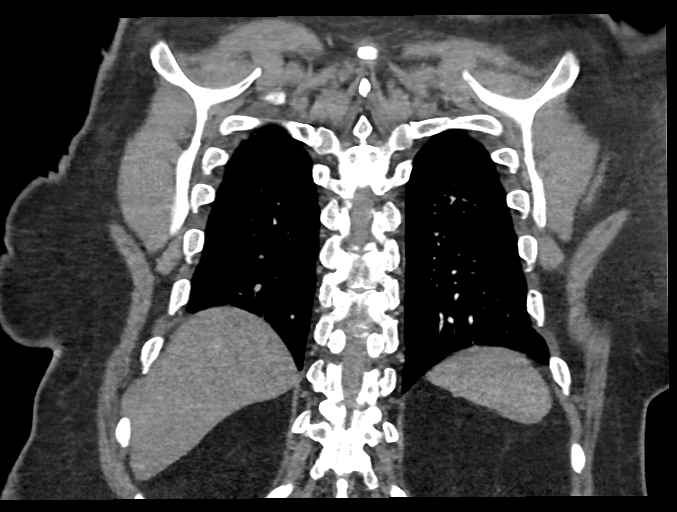

[17 of 46 positions shown; findings below may reference images not displayed]

FINDINGS: Cardiovascular: Satisfactory opacification of the pulmonary arteries
to the segmental level. Positive examination for pulmonary embolism.
Large burden of embolus present bilaterally, in the distal left and
right pulmonary arteries as well as the lobar and more distal branch
vessels. Mild cardiomegaly. Enlargement of the RV LV ratio to 1.1.
Enlargement of the main pulmonary artery up to 3.4 cm. Three-vessel
coronary artery calcifications. Trace pericardial effusion.

Mediastinum/Nodes: No enlarged mediastinal, hilar, or axillary lymph
nodes. Thyroid gland, trachea, and esophagus demonstrate no
significant findings.

Lungs/Pleura: Mosaic attenuation of the airspaces, which may be due
to variable hypoperfusion in the setting of embolus or alternately
small airways disease. No pleural effusion or pneumothorax.

Upper Abdomen: No acute abnormality.

Musculoskeletal: No chest wall abnormality. No acute or significant
osseous findings.

Review of the MIP images confirms the above findings.
IMPRESSION: 1. Positive examination for pulmonary embolism. Large burden of
embolus present bilaterally, in the distal left and right pulmonary
arteries as well as the lobar and more distal branch vessels.

2. Mild cardiomegaly with enlargement of the RV LV ratio to 1.1 and
enlargement of the main pulmonary artery up to 3.4 cm, findings
concerning for massive pulmonary embolism and right heart strain.

3. Mosaic attenuation of the airspaces, which may be due to variable
hypoperfusion in the setting of embolus or alternately small airways
disease.

4.  Coronary artery disease.

These results were called by telephone at the time of interpretation
on 01/15/2020 at [DATE] to PA JAM NANA SACDANAKU , who verbally
acknowledged these results.

## 2020-07-29 IMAGING — DX DG CHEST 2V
2 series · 2 of 2 positions shown · non-contrast
Comparison: 03/08/2017

CLINICAL DATA: Short of breath with leg swelling.

EXAM:
CHEST - 2 VIEW

[w chest pa]
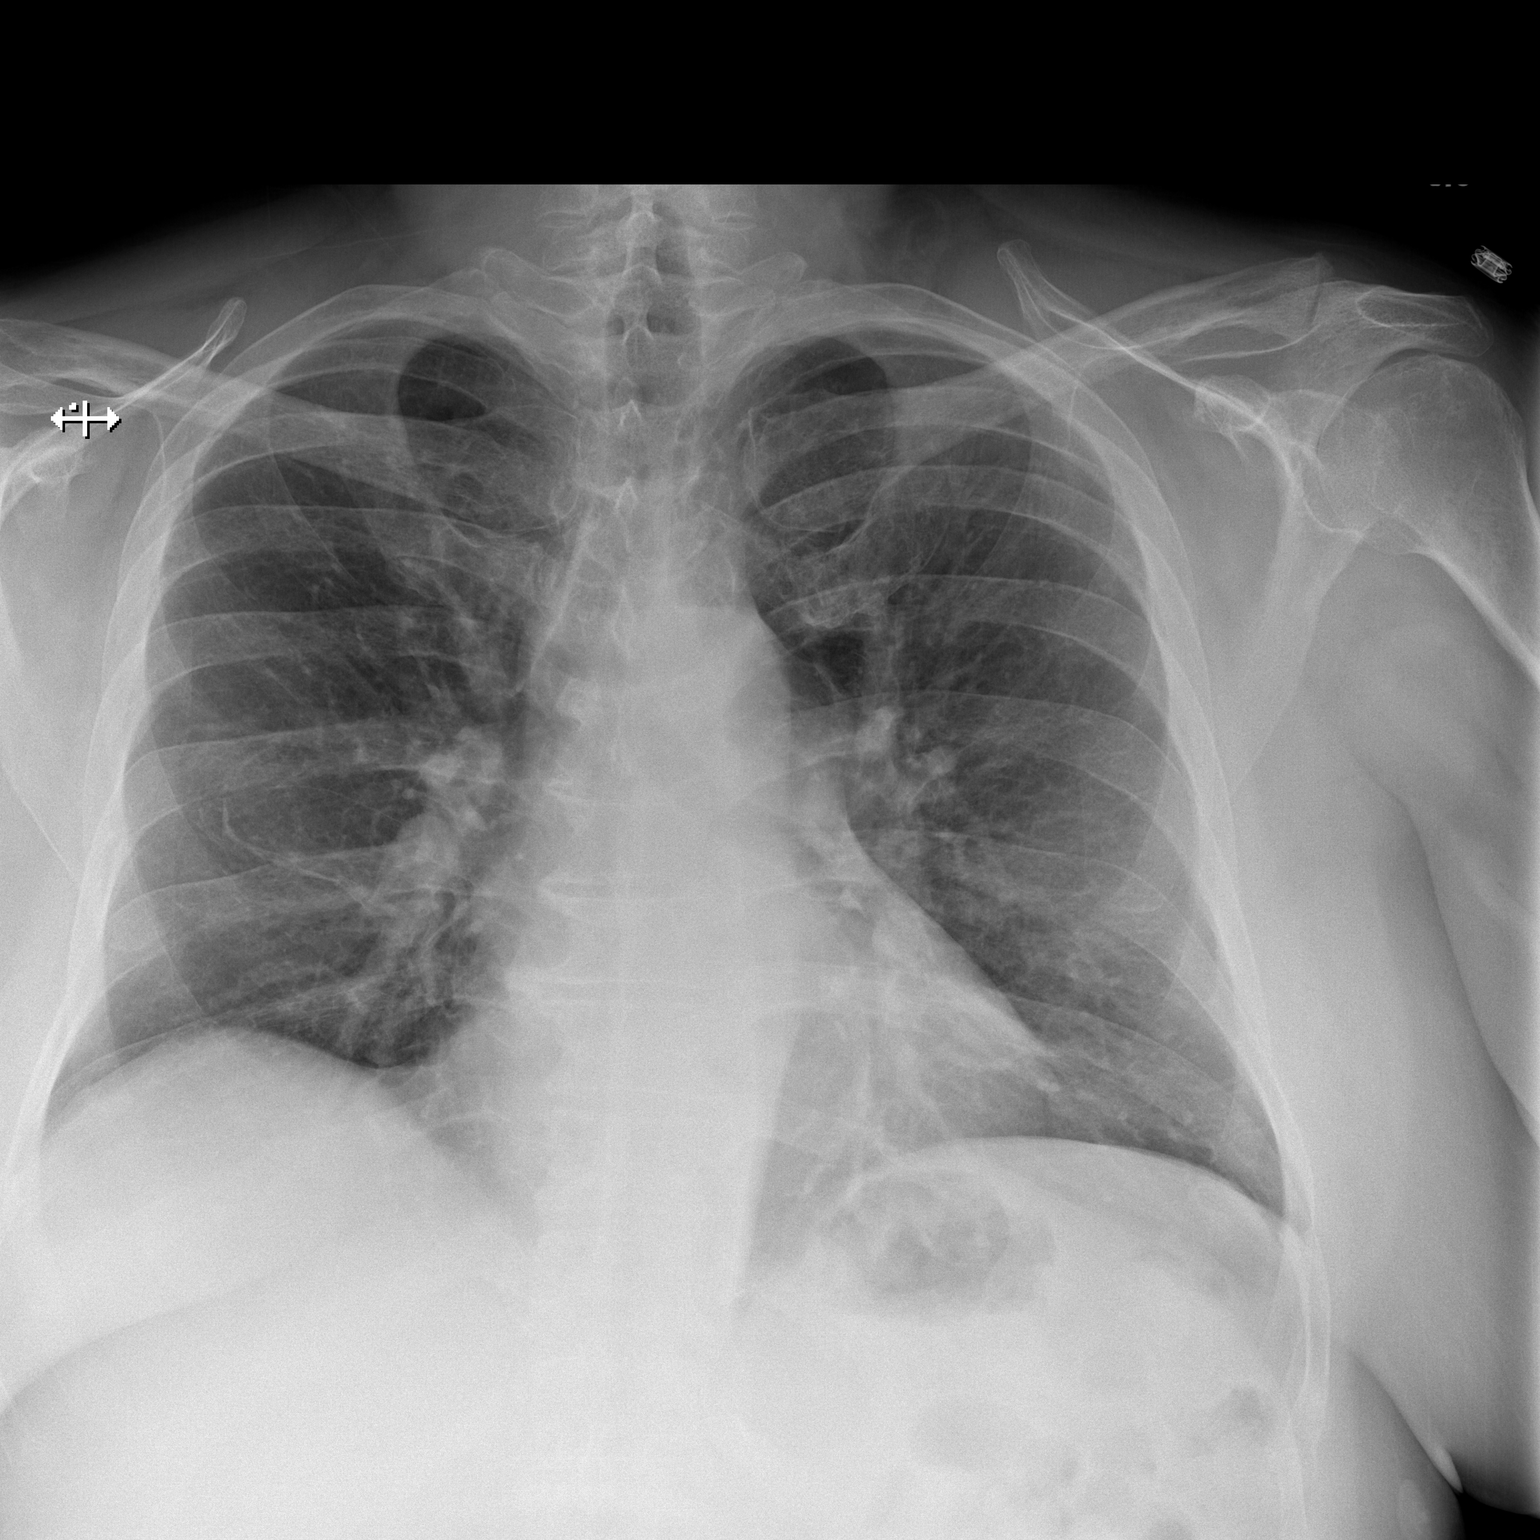

[w chest lat]
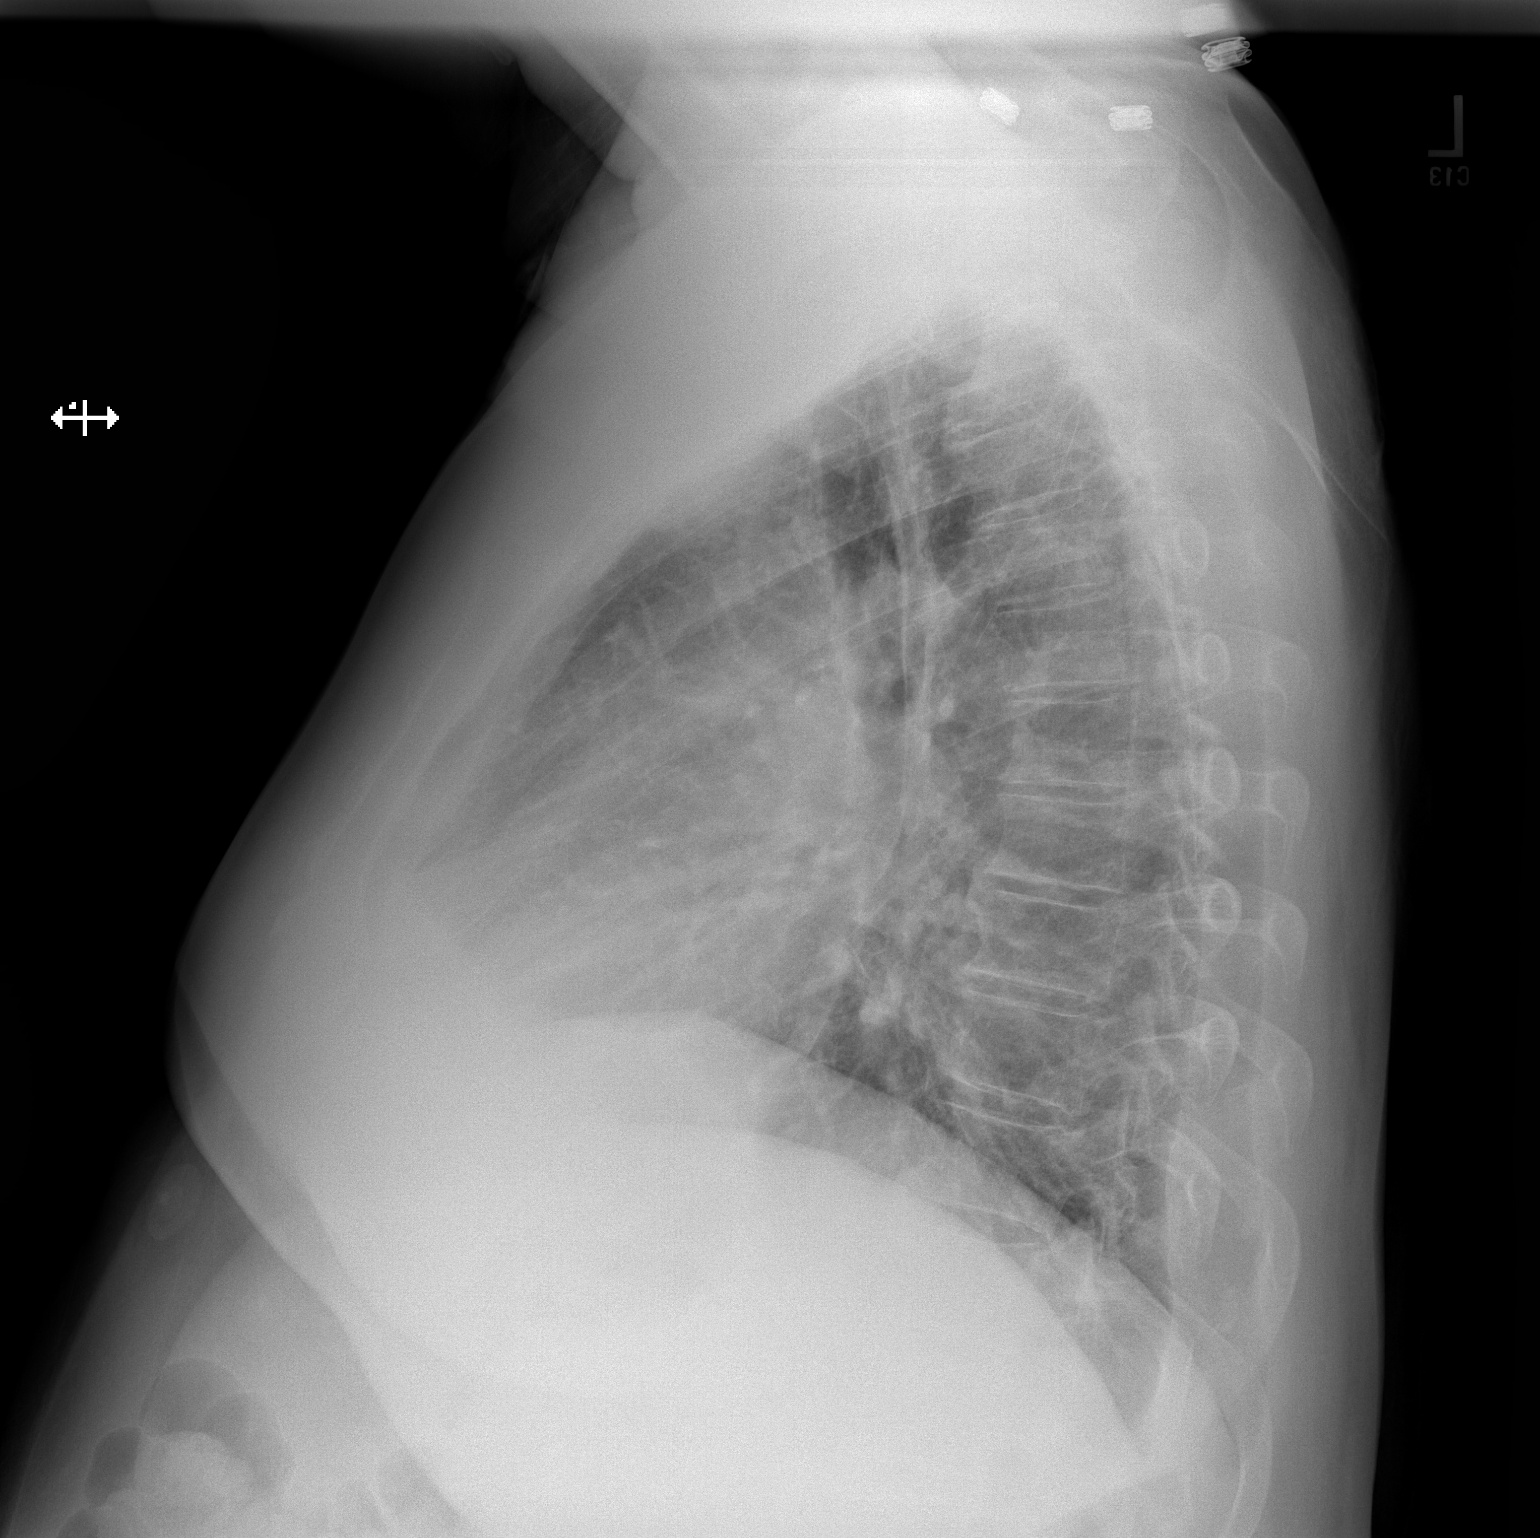

[2 of 2 positions shown; findings below may reference images not displayed]

FINDINGS: The heart size and mediastinal contours are within normal limits.
Both lungs are clear. Unchanged scarring in the left lower lobe. The
visualized skeletal structures are unremarkable.
IMPRESSION: No active cardiopulmonary disease.

## 2023-03-28 ENCOUNTER — Other Ambulatory Visit: Payer: Self-pay | Admitting: Internal Medicine

## 2023-03-28 DIAGNOSIS — K7460 Unspecified cirrhosis of liver: Secondary | ICD-10-CM

## 2023-04-03 ENCOUNTER — Ambulatory Visit (HOSPITAL_COMMUNITY)
Admission: RE | Admit: 2023-04-03 | Discharge: 2023-04-03 | Disposition: A | Discharge status: Home / Self Care | Payer: No Typology Code available for payment source | Source: Ambulatory Visit | Attending: Internal Medicine | Admitting: Internal Medicine

## 2023-04-03 DIAGNOSIS — K7460 Unspecified cirrhosis of liver: Secondary | ICD-10-CM | POA: Diagnosis not present
